# Patient Record
Sex: Male | Born: 1950 | Race: White | Hispanic: No | Marital: Married | Smoking: Former smoker
Health system: Southern US, Community
[De-identification: ages and names within clinical notes are randomized; demographics above are authoritative.]

## PROBLEM LIST (undated history)

## (undated) DIAGNOSIS — E119 Type 2 diabetes mellitus without complications: Secondary | ICD-10-CM

## (undated) HISTORY — PX: COLON SURGERY: SHX602

---

## 2018-12-28 ENCOUNTER — Other Ambulatory Visit: Payer: Self-pay

## 2018-12-28 ENCOUNTER — Inpatient Hospital Stay (HOSPITAL_COMMUNITY)
Admission: EM | Admit: 2018-12-28 | Discharge: 2019-01-01 | DRG: 698 | Disposition: A | Discharge status: Home / Self Care | Payer: Medicare Other | Attending: Internal Medicine | Admitting: Internal Medicine

## 2018-12-28 ENCOUNTER — Emergency Department (HOSPITAL_COMMUNITY): Payer: Medicare Other

## 2018-12-28 ENCOUNTER — Encounter (HOSPITAL_COMMUNITY): Payer: Self-pay | Admitting: *Deleted

## 2018-12-28 DIAGNOSIS — E1165 Type 2 diabetes mellitus with hyperglycemia: Secondary | ICD-10-CM | POA: Diagnosis present

## 2018-12-28 DIAGNOSIS — M62830 Muscle spasm of back: Secondary | ICD-10-CM | POA: Diagnosis present

## 2018-12-28 DIAGNOSIS — Y95 Nosocomial condition: Secondary | ICD-10-CM | POA: Diagnosis present

## 2018-12-28 DIAGNOSIS — Z955 Presence of coronary angioplasty implant and graft: Secondary | ICD-10-CM

## 2018-12-28 DIAGNOSIS — E871 Hypo-osmolality and hyponatremia: Secondary | ICD-10-CM | POA: Diagnosis present

## 2018-12-28 DIAGNOSIS — K7460 Unspecified cirrhosis of liver: Secondary | ICD-10-CM | POA: Diagnosis present

## 2018-12-28 DIAGNOSIS — K7581 Nonalcoholic steatohepatitis (NASH): Secondary | ICD-10-CM | POA: Diagnosis present

## 2018-12-28 DIAGNOSIS — E876 Hypokalemia: Secondary | ICD-10-CM | POA: Diagnosis present

## 2018-12-28 DIAGNOSIS — D696 Thrombocytopenia, unspecified: Secondary | ICD-10-CM | POA: Diagnosis present

## 2018-12-28 DIAGNOSIS — I10 Essential (primary) hypertension: Secondary | ICD-10-CM

## 2018-12-28 DIAGNOSIS — I2699 Other pulmonary embolism without acute cor pulmonale: Secondary | ICD-10-CM

## 2018-12-28 DIAGNOSIS — A419 Sepsis, unspecified organism: Secondary | ICD-10-CM

## 2018-12-28 DIAGNOSIS — Z886 Allergy status to analgesic agent status: Secondary | ICD-10-CM

## 2018-12-28 DIAGNOSIS — R042 Hemoptysis: Secondary | ICD-10-CM | POA: Diagnosis present

## 2018-12-28 DIAGNOSIS — A4151 Sepsis due to Escherichia coli [E. coli]: Secondary | ICD-10-CM | POA: Diagnosis present

## 2018-12-28 DIAGNOSIS — Z91041 Radiographic dye allergy status: Secondary | ICD-10-CM

## 2018-12-28 DIAGNOSIS — I2510 Atherosclerotic heart disease of native coronary artery without angina pectoris: Secondary | ICD-10-CM | POA: Diagnosis present

## 2018-12-28 DIAGNOSIS — Z91048 Other nonmedicinal substance allergy status: Secondary | ICD-10-CM

## 2018-12-28 DIAGNOSIS — J189 Pneumonia, unspecified organism: Secondary | ICD-10-CM | POA: Diagnosis present

## 2018-12-28 DIAGNOSIS — Z881 Allergy status to other antibiotic agents status: Secondary | ICD-10-CM

## 2018-12-28 DIAGNOSIS — Z87891 Personal history of nicotine dependence: Secondary | ICD-10-CM

## 2018-12-28 DIAGNOSIS — Z932 Ileostomy status: Secondary | ICD-10-CM | POA: Diagnosis not present

## 2018-12-28 DIAGNOSIS — N390 Urinary tract infection, site not specified: Secondary | ICD-10-CM | POA: Diagnosis present

## 2018-12-28 DIAGNOSIS — Z888 Allergy status to other drugs, medicaments and biological substances status: Secondary | ICD-10-CM | POA: Diagnosis not present

## 2018-12-28 DIAGNOSIS — R197 Diarrhea, unspecified: Secondary | ICD-10-CM | POA: Diagnosis present

## 2018-12-28 DIAGNOSIS — T83518A Infection and inflammatory reaction due to other urinary catheter, initial encounter: Secondary | ICD-10-CM | POA: Diagnosis not present

## 2018-12-28 DIAGNOSIS — Z933 Colostomy status: Secondary | ICD-10-CM

## 2018-12-28 DIAGNOSIS — Z936 Other artificial openings of urinary tract status: Secondary | ICD-10-CM

## 2018-12-28 DIAGNOSIS — K621 Rectal polyp: Secondary | ICD-10-CM | POA: Diagnosis present

## 2018-12-28 DIAGNOSIS — Z1623 Resistance to quinolones and fluoroquinolones: Secondary | ICD-10-CM | POA: Diagnosis present

## 2018-12-28 DIAGNOSIS — R161 Splenomegaly, not elsewhere classified: Secondary | ICD-10-CM | POA: Diagnosis present

## 2018-12-28 DIAGNOSIS — Z801 Family history of malignant neoplasm of trachea, bronchus and lung: Secondary | ICD-10-CM

## 2018-12-28 DIAGNOSIS — Y846 Urinary catheterization as the cause of abnormal reaction of the patient, or of later complication, without mention of misadventure at the time of the procedure: Secondary | ICD-10-CM | POA: Diagnosis present

## 2018-12-28 HISTORY — DX: Type 2 diabetes mellitus without complications: E11.9

## 2018-12-28 LAB — URINALYSIS, ROUTINE W REFLEX MICROSCOPIC
Bilirubin Urine: NEGATIVE
Glucose, UA: NEGATIVE mg/dL
Ketones, ur: NEGATIVE mg/dL
Nitrite: POSITIVE — AB
Protein, ur: 100 mg/dL — AB
pH: 6 (ref 5.0–8.0)

## 2018-12-28 LAB — CBC WITH DIFFERENTIAL/PLATELET
Abs Immature Granulocytes: 0.08 10*3/uL — ABNORMAL HIGH (ref 0.00–0.07)
BASOS ABS: 0 10*3/uL (ref 0.0–0.1)
Basophils Relative: 0 %
Eosinophils Absolute: 0 10*3/uL (ref 0.0–0.5)
Eosinophils Relative: 0 %
HCT: 37.3 % — ABNORMAL LOW (ref 39.0–52.0)
Hemoglobin: 12.2 g/dL — ABNORMAL LOW (ref 13.0–17.0)
Immature Granulocytes: 1 %
Lymphocytes Relative: 5 %
Lymphs Abs: 0.8 10*3/uL (ref 0.7–4.0)
MCH: 29.2 pg (ref 26.0–34.0)
MCHC: 32.7 g/dL (ref 30.0–36.0)
MCV: 89.2 fL (ref 80.0–100.0)
Monocytes Absolute: 1.5 10*3/uL — ABNORMAL HIGH (ref 0.1–1.0)
Monocytes Relative: 10 %
NEUTROS PCT: 84 %
NRBC: 0 % (ref 0.0–0.2)
Neutro Abs: 12.8 10*3/uL — ABNORMAL HIGH (ref 1.7–7.7)
Platelets: 131 10*3/uL — ABNORMAL LOW (ref 150–400)
RBC: 4.18 MIL/uL — ABNORMAL LOW (ref 4.22–5.81)
RDW: 12.3 % (ref 11.5–15.5)
WBC: 15.2 10*3/uL — ABNORMAL HIGH (ref 4.0–10.5)

## 2018-12-28 LAB — COMPREHENSIVE METABOLIC PANEL
ALBUMIN: 3 g/dL — AB (ref 3.5–5.0)
ALT: 22 U/L (ref 0–44)
AST: 26 U/L (ref 15–41)
Alkaline Phosphatase: 60 U/L (ref 38–126)
Anion gap: 13 (ref 5–15)
BUN: 20 mg/dL (ref 8–23)
CO2: 22 mmol/L (ref 22–32)
Calcium: 9.5 mg/dL (ref 8.9–10.3)
Chloride: 93 mmol/L — ABNORMAL LOW (ref 98–111)
Creatinine, Ser: 1.04 mg/dL (ref 0.61–1.24)
GFR calc Af Amer: >60 mL/min (ref >60)
GFR calc non Af Amer: >60 mL/min (ref >60)
Glucose, Bld: 285 mg/dL — ABNORMAL HIGH (ref 70–99)
Potassium: 4.2 mmol/L (ref 3.5–5.1)
Sodium: 128 mmol/L — ABNORMAL LOW (ref 135–145)
Total Bilirubin: 1.3 mg/dL — ABNORMAL HIGH (ref 0.3–1.2)
Total Protein: 7.1 g/dL (ref 6.5–8.1)

## 2018-12-28 LAB — BLOOD GAS, VENOUS
Acid-Base Excess: 0.4 mmol/L (ref 0.0–2.0)
Bicarbonate: 25.2 mmol/L (ref 20.0–28.0)
FIO2: 0.21
O2 Saturation: 96.2 %
Patient temperature: 37
pCO2, Ven: 33 mmHg — ABNORMAL LOW (ref 44.0–60.0)
pH, Ven: 7.469 — ABNORMAL HIGH (ref 7.250–7.430)
pO2, Ven: 79.5 mmHg — ABNORMAL HIGH (ref 32.0–45.0)

## 2018-12-28 LAB — LACTIC ACID, PLASMA
Lactic Acid, Venous: 3.2 mmol/L (ref 0.5–1.9)
Lactic Acid, Venous: 2.9 mmol/L (ref 0.5–1.9)

## 2018-12-28 LAB — URINALYSIS, MICROSCOPIC (REFLEX): Squamous Epithelial / HPF: NONE SEEN (ref 0–5)

## 2018-12-28 LAB — TROPONIN I: Troponin I: <0.03 ng/mL (ref <0.03)

## 2018-12-28 MED ORDER — ACETAMINOPHEN 325 MG PO TABS
650.0000 mg | ORAL_TABLET | Freq: Four times a day (QID) | ORAL | Status: DC | PRN
  Administered 2018-12-30: 650 mg via ORAL
  Filled 2018-12-28: qty 2

## 2018-12-28 MED ORDER — ZINC GLUCONATE 50 MG PO TABS: 50.0000 mg | ORAL_TABLET | Freq: Two times a day (BID) | ORAL | Status: DC

## 2018-12-28 MED ORDER — MAGNESIUM CARBONATE 54 (MAG EQUIV) MG/5ML PO LIQD
5.0000 mL | Freq: Four times a day (QID) | ORAL | Status: DC
  Filled 2018-12-28 (×14): qty 5

## 2018-12-28 MED ORDER — HYDROCHLOROTHIAZIDE 25 MG PO TABS: 25.0000 mg | ORAL_TABLET | Freq: Every day | ORAL | Status: DC

## 2018-12-28 MED ORDER — VANCOMYCIN HCL 10 G IV SOLR
2000.0000 mg | Freq: Once | INTRAVENOUS | Status: DC
  Filled 2018-12-28: qty 2000

## 2018-12-28 MED ORDER — METHYLPREDNISOLONE SODIUM SUCC 40 MG IJ SOLR
40.0000 mg | Freq: Once | INTRAMUSCULAR | Status: AC
  Administered 2018-12-28: 40 mg via INTRAVENOUS
  Filled 2018-12-28: qty 1

## 2018-12-28 MED ORDER — DIPHENHYDRAMINE HCL 50 MG/ML IJ SOLN
50.0000 mg | Freq: Once | INTRAMUSCULAR | Status: AC
  Administered 2018-12-28: 50 mg via INTRAVENOUS
  Filled 2018-12-28: qty 1

## 2018-12-28 MED ORDER — SODIUM CHLORIDE 0.9 % IV SOLN
1000.0000 mL | INTRAVENOUS | Status: DC
  Administered 2018-12-28 – 2018-12-29 (×2): 1000 mL via INTRAVENOUS

## 2018-12-28 MED ORDER — OXYBUTYNIN CHLORIDE 5 MG PO TABS
5.0000 mg | ORAL_TABLET | Freq: Three times a day (TID) | ORAL | Status: DC | PRN
  Administered 2018-12-30 – 2018-12-31 (×2): 5 mg via ORAL
  Filled 2018-12-28 (×2): qty 1

## 2018-12-28 MED ORDER — IOPAMIDOL (ISOVUE-300) INJECTION 61%
100.0000 mL | Freq: Once | INTRAVENOUS | Status: AC | PRN
  Administered 2018-12-28: 100 mL via INTRAVENOUS

## 2018-12-28 MED ORDER — FERROUS SULFATE 325 (65 FE) MG PO TABS
325.0000 mg | ORAL_TABLET | Freq: Two times a day (BID) | ORAL | Status: DC
  Administered 2018-12-29 – 2019-01-01 (×7): 325 mg via ORAL
  Filled 2018-12-28 (×8): qty 1

## 2018-12-28 MED ORDER — CALCIUM CARBONATE-VITAMIN D 500-200 MG-UNIT PO TABS
1.0000 | ORAL_TABLET | Freq: Two times a day (BID) | ORAL | Status: DC
  Administered 2018-12-29 – 2019-01-01 (×7): 1 via ORAL
  Filled 2018-12-28 (×8): qty 1

## 2018-12-28 MED ORDER — SPIRONOLACTONE 25 MG PO TABS: 25.0000 mg | ORAL_TABLET | Freq: Every day | ORAL | Status: DC

## 2018-12-28 MED ORDER — SODIUM CHLORIDE 0.9 % IV SOLN
2.0000 g | Freq: Three times a day (TID) | INTRAVENOUS | Status: DC
  Administered 2018-12-29 – 2019-01-01 (×9): 2 g via INTRAVENOUS
  Filled 2018-12-28 (×15): qty 2

## 2018-12-28 MED ORDER — METOPROLOL TARTRATE 25 MG PO TABS
25.0000 mg | ORAL_TABLET | Freq: Every day | ORAL | Status: DC
  Administered 2018-12-29 – 2018-12-30 (×2): 25 mg via ORAL
  Filled 2018-12-28 (×2): qty 1

## 2018-12-28 MED ORDER — VANCOMYCIN HCL IN DEXTROSE 1-5 GM/200ML-% IV SOLN: 1000.0000 mg | Freq: Once | INTRAVENOUS | Status: DC

## 2018-12-28 MED ORDER — LOPERAMIDE HCL 2 MG PO CAPS
2.0000 mg | ORAL_CAPSULE | Freq: Four times a day (QID) | ORAL | Status: DC | PRN
  Administered 2018-12-30 – 2018-12-31 (×3): 2 mg via ORAL
  Filled 2018-12-28 (×3): qty 1

## 2018-12-28 MED ORDER — ADULT MULTIVITAMIN W/MINERALS CH
1.0000 | ORAL_TABLET | Freq: Every day | ORAL | Status: DC
  Administered 2018-12-29 – 2019-01-01 (×4): 1 via ORAL
  Filled 2018-12-28 (×4): qty 1

## 2018-12-28 MED ORDER — SODIUM CHLORIDE 0.9 % IV SOLN
1000.0000 mL | INTRAVENOUS | Status: DC
  Administered 2018-12-28: 1000 mL via INTRAVENOUS

## 2018-12-28 MED ORDER — INSULIN GLARGINE 100 UNIT/ML ~~LOC~~ SOLN
128.0000 [IU] | Freq: Every morning | SUBCUTANEOUS | Status: DC
  Administered 2018-12-29 – 2019-01-01 (×4): 128 [IU] via SUBCUTANEOUS
  Filled 2018-12-28 (×5): qty 1.28

## 2018-12-28 MED ORDER — VANCOMYCIN HCL 10 G IV SOLR
1250.0000 mg | Freq: Two times a day (BID) | INTRAVENOUS | Status: DC
  Filled 2018-12-28 (×3): qty 1250

## 2018-12-28 MED ORDER — SODIUM CHLORIDE 0.9 % IV SOLN
2.0000 g | Freq: Once | INTRAVENOUS | Status: DC
  Filled 2018-12-28: qty 2

## 2018-12-28 MED ORDER — SODIUM CHLORIDE 0.9 % IV SOLN
2.0000 g | Freq: Once | INTRAVENOUS | Status: AC
  Administered 2018-12-28: 2 g via INTRAVENOUS
  Filled 2018-12-28: qty 2

## 2018-12-28 MED ORDER — PANTOPRAZOLE SODIUM 40 MG PO TBEC
40.0000 mg | DELAYED_RELEASE_TABLET | Freq: Every day | ORAL | Status: DC
  Administered 2018-12-29 – 2019-01-01 (×4): 40 mg via ORAL
  Filled 2018-12-28 (×4): qty 1

## 2018-12-28 MED ORDER — ACETAMINOPHEN 650 MG RE SUPP: 650.0000 mg | Freq: Four times a day (QID) | RECTAL | Status: DC | PRN

## 2018-12-28 MED ORDER — ISOSORBIDE MONONITRATE 10 MG PO TABS
15.0000 mg | ORAL_TABLET | Freq: Two times a day (BID) | ORAL | Status: DC
  Administered 2018-12-29 – 2019-01-01 (×7): 15 mg via ORAL
  Filled 2018-12-28 (×12): qty 1.5

## 2018-12-28 MED ORDER — INSULIN ASPART 100 UNIT/ML ~~LOC~~ SOLN
0.0000 [IU] | Freq: Three times a day (TID) | SUBCUTANEOUS | Status: DC
  Administered 2018-12-29: 7 [IU] via SUBCUTANEOUS
  Administered 2018-12-29: 9 [IU] via SUBCUTANEOUS
  Administered 2018-12-29: 5 [IU] via SUBCUTANEOUS
  Administered 2018-12-30: 3 [IU] via SUBCUTANEOUS
  Administered 2018-12-30: 5 [IU] via SUBCUTANEOUS
  Administered 2018-12-31: 2 [IU] via SUBCUTANEOUS
  Administered 2019-01-01: 7 [IU] via SUBCUTANEOUS

## 2018-12-28 NOTE — ED Provider Notes (Signed)
[facility] EMERGENCY DEPARTMENT Provider Note   CSN: [id number] Arrival date & time: 12/28/18  1644     History   Chief Complaint Chief Complaint  Patient presents with  . Flank Pain    right    HPI Shaaron Adlerrthur G Landgren is a 68 y.o. male.  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  The patient has done well since his surgery 2 months ago however in the last week he has developed some fevers as high as 101.9, increased amounts of coughing, feeling pain with deep breathing mostly located in the right chest and radiating to the right shoulder and today had some hemoptysis.  He has been taking tylenol with some relief of fever.  He has had no changes in amount of urine made -d rinking plenty of fluids - no diarrhea, rectal bleeding or increased abdominal pain.  He reports having a history of allergy to IV contrast dye which gives him hives and for which she is able to get a CT scan if he is pretreated.    He is visiting from [place].  Past Medical History:  Diagnosis Date  . Diabetes mellitus without complication (HCC)    type 2    There are no active problems to display for this patient.     Home Medications    Prior to Admission medications   Not on File    Family History No family history on file.  Social History Social History   Tobacco Use  . Smoking status: Former Games developermoker  .  Smokeless tobacco: Never Used  Substance Use Topics  . Alcohol use: Never    Frequency: Never  . Drug use: Never     Allergies   Patient has no allergy information on record.   Review of Systems Review of Systems  All other systems reviewed and are negative.    Physical Exam Updated Vital Signs BP 113/72 (BP Location: Right Arm)   Pulse 80   Temp 98.3 F (36.8 C) (Oral)   Resp 18   Ht 1.778 m (5\' 10" )   Wt 99.8 kg   SpO2 92%   BMI 31.57 kg/m   Physical Exam Vitals signs and nursing note reviewed.  Constitutional:      General: He is not in acute distress.    Appearance: He is well-developed.  HENT:     Head: Normocephalic and atraumatic.     Mouth/Throat:     Pharynx: No oropharyngeal exudate.  Eyes:     General: No scleral icterus.       Right eye: No discharge.        Left eye: No discharge.     Conjunctiva/sclera: Conjunctivae normal.     Pupils: Pupils are equal, round, and reactive to light.  Neck:     Musculoskeletal: Normal range of motion and neck supple.     Thyroid: No thyromegaly.  Vascular: No JVD.  Cardiovascular:     Rate and Rhythm: Normal rate and regular rhythm.     Heart sounds: Normal heart sounds. No murmur. No friction rub. No gallop.   Pulmonary:     Effort: Pulmonary effort is normal. No respiratory distress.     Breath sounds: Rales present. No wheezing.     Comments: The patient appears mildly tachypneic, oxygen ranges between 92 and 95%, he has rales at the right base and right midlung, left lung is clear. Abdominal:     General: Bowel sounds are normal. There is no distension.     Palpations: Abdomen is soft. There is no mass.     Tenderness: There is abdominal tenderness ( Mild tenderness to the mid lower abdomen).     Comments: Diverting ileostomy present in the right mid abdomen, formed brown stool present  Genitourinary:    Comments: Both Foley catheter and suprapubic catheter in place Musculoskeletal: Normal range of  motion.        General: No tenderness.     Comments: Extremities are symmetrical, minimal scant edema bilaterally, symmetrical  Lymphadenopathy:     Cervical: No cervical adenopathy.  Skin:    General: Skin is warm and dry.     Findings: No erythema or rash.     Comments: Scattered nonpruritic rash over the bilateral lower extremities  Neurological:     Mental Status: He is alert.     Coordination: Coordination normal.     Comments: Awake and alert and answering questions appropriately, follows commands without any difficulty  Psychiatric:        Behavior: Behavior normal.      ED Treatments / Results  Labs (all labs ordered are listed, but only abnormal results are displayed) Labs Reviewed  CULTURE, BLOOD (ROUTINE X 2)  CULTURE, BLOOD (ROUTINE X 2)  URINE CULTURE  CBC WITH DIFFERENTIAL/PLATELET  COMPREHENSIVE METABOLIC PANEL  TROPONIN I  BLOOD GAS, VENOUS  URINALYSIS, ROUTINE W REFLEX MICROSCOPIC  LACTIC ACID, PLASMA  LACTIC ACID, PLASMA    EKG None  Radiology No results found.  Procedures .Critical Care Performed by: Eber Hong, MD Authorized by: Eber Hong, MD   Critical care provider statement:    Critical care time (minutes):  35   Critical care time was exclusive of:  Separately billable procedures and treating other patients and teaching time   Critical care was necessary to treat or prevent imminent or life-threatening deterioration of the following conditions:  Sepsis   Critical care was time spent personally by me on the following activities:  Blood draw for specimens, development of treatment plan with patient or surrogate, discussions with consultants, evaluation of patient's response to treatment, examination of patient, obtaining history from patient or surrogate, ordering and performing treatments and interventions, ordering and review of laboratory studies, ordering and review of radiographic studies, pulse oximetry, re-evaluation of patient's  condition and review of old charts   (including critical care time)  Medications Ordered in ED Medications  0.9 %  sodium chloride infusion (has no administration in time range)  vancomycin (VANCOCIN) IVPB 1000 mg/200 mL premix (has no administration in time range)  ceFEPIme (MAXIPIME) 2 g in sodium chloride 0.9 % 100 mL IVPB (has no administration in time range)     Initial Impression / Assessment and Plan / ED Course  I have reviewed the triage vital signs and the nursing notes.  Pertinent labs & imaging results that were available during my care of the patient  were reviewed by me and considered in my medical decision making (see chart for details).    Certainly with this patient's postoperative period and now having hemoptysis pleuritic chest pain and a cough I am concerned about a pulmonary embolism however I would also consider pneumonia given the fevers rales at the right base etc.  He will need further imaging and likely needs to have a CT scan, he will need pretreatment with steroids and Benadryl which he states he has had successfully in the past.  Because of his history of abdominal surgery this will be also imaged with CT scan to make sure there is no developing source of infection in his pelvis.  I have personally looked at the patient's x-ray of the chest and it does confirm that he has some right basilar infiltrates consistent with an infiltrate but would also consider that this could be pulmonary embolism.  He also has a urinary tract infection, he has a leukocytosis, a hyponatremia, and hyperglycemia with a blood sugar of 285.  Troponin is negative  At this time the etiology of the patient's symptoms will need admission to the hospital whether this is sepsis from pneumonia or urinary infection or whether this is related to an underlying pulmonary embolism.  Because of his allergies he has been pretreated for the CT scan but this will not be able to be performed for several more  hours.  Broad-spectrum antibiotics have been ordered including Maxipime and vancomycin.  Cultures of the blood in the urine have been obtained  maxipime allergy - aztreonam added CT ordered Dr. Sharl MaLama to admit Pt has sepsis, CC provided  Final Clinical Impressions(s) / ED Diagnoses   Final diagnoses:  Sepsis without acute organ dysfunction, due to unspecified organism [facility](HCC)      Eber HongMiller, Dontravious Camille, MD 12/28/18 1924

## 2018-12-28 NOTE — ED Triage Notes (Signed)
Patient had surgery on October 24, 2018 to correct a fistula from May  2012.  Patient now has suprapubic foley, indwelling foley and colostomy bag (right lower quadrant).  Patient complains of right sided flank pain with productive cough including bright red blood as described by patient for 2 days.  Patient is alert, oriented x's 4 and can ambulate.

## 2018-12-28 NOTE — H&P (Addendum)
TRH H&P    Patient Demographics:    Michael Salinas, is a 68 y.o. male  MRN: [id number]  DOB - 08-Feb-1951  Admit Date - 12/28/2018  Referring MD/NP/PA: Eber Hong  Outpatient Primary MD for the patient is System, Pcp Not In  Patient coming from: Home  Chief complaint-coughing up blood   HPI:    Michael Salinas  is a 68 y.o. 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 Patient says that he has done well since surgery but over past 2 days he developed fever as high as one 1.9 with worsening cough initially white to yellow in color and became bloody this morning. He denies nausea vomiting.  Denies abdominal pain. Denies shortness of breath. In the ED chest x-ray showed possible right lower lobe pneumonia and patient started on vancomycin and cefepime. CTA chest was ordered to rule out PE but patient has allergy to IV contrast dye and is currently being pretreated with Benadryl and Solu-Medrol before the CT scan.  No previous history of stroke or seizures.   Review of systems:    In addition to the HPI above,   All other systems reviewed and are negative.    Past History of the following :    Past Medical History:  Diagnosis Date  . Diabetes mellitus without complication (HCC)    type 2         Social History:      Social History   Tobacco Use  . Smoking status: Former Games developer  . Smokeless tobacco: Never Used  Substance Use Topics  . Alcohol use: Never    Frequency: Never       Family  History :   Patient's mother side there is a family history of lung cancer.   Home Medications:   Prior to Admission medications   Medication Sig Start Date End Date Taking? Authorizing Provider  ascorbic acid (VITAMIN C) 1000 MG tablet Take 1,000 mg by mouth 2 (two) times daily.   Yes [provider]  aspirin EC 81 MG tablet Take 81 mg by mouth daily.   Yes [provider]  calcium citrate-vitamin D (CITRACAL+D) 315-200 MG-UNIT tablet Take 1 tablet by mouth 2 (two) times daily.   Yes [provider]  Dulaglutide (TRULICITY) 0.75 MG/0.5ML SOPN Inject 0.75 mg into the skin every Sunday.   Yes [provider]  eplerenone (INSPRA) 50 MG tablet Take 50 mg by mouth every morning.   Yes [provider]  ferrous sulfate 325 (65 FE) MG tablet Take 325 mg by mouth 2 (two) times daily.   Yes [provider]  hydrochlorothiazide (HYDRODIURIL) 25 MG tablet Take 25 mg  by mouth daily.   Yes [provider]  HYDROmorphone (DILAUDID) 2 MG tablet Take 2 mg by mouth every 4 (four) hours.   Yes [provider]  insulin aspart (NOVOLOG FLEXPEN) 100 UNIT/ML FlexPen Inject 12 Units into the skin 3 (three) times daily with meals. Add 2 units for every level of 50 over as directed by physician per sliding scale   Yes [provider]  Insulin Degludec (TRESIBA FLEXTOUCH) 200 UNIT/ML SOPN Inject 128 Units into the skin every morning.   Yes [provider]  isosorbide mononitrate (ISMO,MONOKET) 10 MG tablet Take 15 mg by mouth 2 (two) times daily.   Yes [provider]  loperamide (IMODIUM A-D) 2 MG tablet Take 2 mg by mouth 4 (four) times daily as needed for diarrhea or loose stools.   Yes [provider]  Magnesium Carbonate (MAGONATE) 54 (Mag Equiv) MG/5ML LIQD Take 5 mLs by mouth 4 (four) times daily.   Yes [provider]  metFORMIN (GLUCOPHAGE) 1000 MG tablet Take 1,000 mg by mouth 2 (two) times daily  with a meal.   Yes [provider]  metoprolol tartrate (LOPRESSOR) 25 MG tablet Take 25 mg by mouth daily.   Yes [provider]  Multiple Vitamin (MULTIVITAMIN WITH MINERALS) TABS tablet Take 1 tablet by mouth daily.   Yes [provider]  nystatin (MYCOSTATIN/NYSTOP) powder Apply topically 2 (two) times daily as needed (for redness/irritation).   Yes [provider]  omeprazole (PRILOSEC) 20 MG capsule Take 20 mg by mouth every morning.   Yes [provider]  OVER THE COUNTER MEDICATION Take 1,350 mg by mouth 2 (two) times daily. [place]   Yes [provider]  oxybutynin (DITROPAN) 5 MG tablet Take 5 mg by mouth 3 (three) times daily as needed for bladder spasms.   Yes [provider]  prasugrel (EFFIENT) 5 MG TABS tablet Take 5 mg by mouth daily.   Yes [provider]  Vitamin D, Ergocalciferol, (DRISDOL) 1.25 MG (50000 UT) CAPS capsule Take 50,000 Units by mouth 2 (two) times a week. Mondays and Thursdays   Yes [provider]  zinc gluconate 50 MG tablet Take 50 mg by mouth 2 (two) times daily.   Yes [provider]     Allergies:     Allergies  Allergen Reactions  . Ace Inhibitors Swelling  . Amlodipine Swelling  . Ceftriaxone Hives and Other (See Comments)    Reaction is unknown to patient. Possible reaction resulting in hives   . Nsaids Other (See Comments)    Stomach pain  . Phenergan [Promethazine Hcl] Other (See Comments)    hallucinations  . Versed [Midazolam] Nausea And Vomiting  . Adhesive [Tape] Rash    Skin irritation  . Contrast Media [Iodinated Diagnostic Agents] Rash  . Latex Rash    Skin irritation-severe     Physical Exam:   Vitals  Blood pressure 127/65, pulse 74, temperature 98.7 F (37.1 C), temperature source Oral, resp. rate 17, height 5\' 10"  (1.778 m), weight 99.8 kg, SpO2 94 %.  1.  General: Appears in no acute distress  2. Psychiatric: Alert,  oriented x3, intact insight and judgment  3. Neurologic: Cranial nerves II through XII grossly intact, motor strength is 5/5 in both upper and lower extremities  4. HEENMT:  Atraumatic normocephalic, oral mucosa is mildly dry.  PERRLA  5. Respiratory : Normal respiratory effort.  Crackles auscultated at right lung base.  6. Cardiovascular : S1-S2,  regular, no murmurs auscultated.  No edema of the lower extremities noted  7. Gastrointestinal:  Abdomen is soft, nontender, ileostomy in place.  Suprapubic catheter in place  8. Skin:  No rash noted  9.Musculoskeletal:  No limitation of range of motion of joints of upper and lower extremities    Data Review:    CBC Recent Labs  Lab 12/28/18 1723  WBC 15.2*  HGB 12.2*  HCT 37.3*  PLT 131*  MCV 89.2  MCH 29.2  MCHC 32.7  RDW 12.3  LYMPHSABS 0.8  MONOABS 1.5*  EOSABS 0.0  BASOSABS 0.0   ------------------------------------------------------------------------------------------------------------------  Results for orders placed or performed during the hospital encounter of 12/28/18 (from the past 48 hour(s))  CBC with Differential/Platelet     Status: Abnormal   Collection Time: 12/28/18  5:23 PM  Result Value Ref Range   WBC 15.2 (H) 4.0 - 10.5 K/uL   RBC 4.18 (L) 4.22 - 5.81 MIL/uL   Hemoglobin 12.2 (L) 13.0 - 17.0 g/dL   HCT 69.6 (L) 29.5 - 28.4 %   MCV 89.2 80.0 - 100.0 fL   MCH 29.2 26.0 - 34.0 pg   MCHC 32.7 30.0 - 36.0 g/dL   RDW 13.2 44.0 - 10.2 %   Platelets 131 (L) 150 - 400 K/uL   nRBC 0.0 0.0 - 0.2 %   Neutrophils Relative % 84 %   Neutro Abs 12.8 (H) 1.7 - 7.7 K/uL   Lymphocytes Relative 5 %   Lymphs Abs 0.8 0.7 - 4.0 K/uL   Monocytes Relative 10 %   Monocytes Absolute 1.5 (H) 0.1 - 1.0 K/uL   Eosinophils Relative 0 %   Eosinophils Absolute 0.0 0.0 - 0.5 K/uL   Basophils Relative 0 %   Basophils Absolute 0.0 0.0 - 0.1 K/uL   Immature Granulocytes 1 %   Abs Immature Granulocytes 0.08 (H) 0.00 -  0.07 K/uL    Comment: Performed at [facility], [address] [place], [place] [address]  Comprehensive metabolic panel     Status: Abnormal   Collection Time: 12/28/18  5:23 PM  Result Value Ref Range   Sodium 128 (L) 135 - 145 mmol/L   Potassium 4.2 3.5 - 5.1 mmol/L   Chloride 93 (L) 98 - 111 mmol/L   CO2 22 22 - 32 mmol/L   Glucose, Bld 285 (H) 70 - 99 mg/dL   BUN 20 8 - 23 mg/dL   Creatinine, Ser 6.44 0.61 - 1.24 mg/dL   Calcium 9.5 8.9 - 03.4 mg/dL   Total Protein 7.1 6.5 - 8.1 g/dL   Albumin 3.0 (L) 3.5 - 5.0 g/dL   AST 26 15 - 41 U/L   ALT 22 0 - 44 U/L   Alkaline Phosphatase 60 38 - 126 U/L   Total Bilirubin 1.3 (H) 0.3 - 1.2 mg/dL   GFR calc non Af Amer >60 >60 mL/min   GFR calc Af Amer >60 >60 mL/min   Anion gap 13 5 - 15    Comment: Performed at [facility], [address], [place], [place] [address]  Troponin I - ONCE - STAT     Status: None   Collection Time: 12/28/18  5:23 PM  Result Value Ref Range   Troponin I <0.03 <0.03 ng/mL    Comment: Performed at [facility], [address], [place], [place] [address]  Blood gas, venous     Status: Abnormal   Collection Time: 12/28/18  5:35 PM  Result Value Ref Range  FIO2 0.21    pH, Ven 7.469 (H) 7.250 - 7.430   pCO2, Ven 33.0 (L) 44.0 - 60.0 mmHg   pO2, Ven 79.5 (H) 32.0 - 45.0 mmHg   Bicarbonate 25.2 20.0 - 28.0 mmol/L   Acid-Base Excess 0.4 0.0 - 2.0 mmol/L   O2 Saturation 96.2 %   Patient temperature 37.0     Comment: Performed at [facility], [address] [place], [place] [address]  Blood Culture (routine x 2)     Status: None (Preliminary result)   Collection Time: 12/28/18  5:36 PM  Result Value Ref Range   Specimen Description BLOOD LEFT HAND    Special Requests      BOTTLES DRAWN AEROBIC AND ANAEROBIC Blood Culture adequate volume Performed at [facility], [address] [place], [place] [address]    Culture PENDING    Report Status PENDING   Lactic acid, plasma     Status: Abnormal    Collection Time: 12/28/18  5:37 PM  Result Value Ref Range   Lactic Acid, Venous 3.2 (HH) 0.5 - 1.9 mmol/L    Comment: CRITICAL RESULT CALLED TO, READ BACK BY AND VERIFIED WITH: KEMP,C. AT 1856 ON 12/28/2018 BY EVA Performed at [facility], [address] [place], [place] [address]   Urinalysis, Routine w reflex microscopic     Status: Abnormal   Collection Time: 12/28/18  5:50 PM  Result Value Ref Range   Color, Urine BROWN (A) YELLOW    Comment: BIOCHEMICALS MAY BE AFFECTED BY COLOR   APPearance HAZY (A) CLEAR   Specific Gravity, Urine >1.030 (H) 1.005 - 1.030   pH 6.0 5.0 - 8.0   Glucose, UA NEGATIVE NEGATIVE mg/dL   Hgb urine dipstick LARGE (A) NEGATIVE   Bilirubin Urine NEGATIVE NEGATIVE   Ketones, ur NEGATIVE NEGATIVE mg/dL   Protein, ur 914100 (A) NEGATIVE mg/dL   Nitrite POSITIVE (A) NEGATIVE   Leukocytes, UA MODERATE (A) NEGATIVE    Comment: Performed at [facility], [address] [place], [place] [address]  Urinalysis, Microscopic (reflex)     Status: Abnormal   Collection Time: 12/28/18  5:50 PM  Result Value Ref Range   RBC / HPF 11-20 0 - 5 RBC/hpf   WBC, UA 21-50 0 - 5 WBC/hpf   Bacteria, UA MANY (A) NONE SEEN   Squamous Epithelial / LPF NONE SEEN 0 - 5   Mucus PRESENT     Comment: Performed at [facility], [address] [place], [place] [address]    Chemistries  Recent Labs  Lab 12/28/18 1723  NA 128*  K 4.2  CL 93*  CO2 22  GLUCOSE 285*  BUN 20  CREATININE 1.04  CALCIUM 9.5  AST 26  ALT 22  ALKPHOS 60  BILITOT 1.3*   ------------------------------------------------------------------------------------------------------------------  ------------------------------------------------------------------------------------------------------------------ GFR: Estimated Creatinine Clearance: 83.8 mL/min (by C-G formula based on SCr of 1.04 mg/dL). Liver Function Tests: Recent Labs  Lab 12/28/18 1723  AST 26  ALT 22  ALKPHOS 60  BILITOT 1.3*  PROT  7.1  ALBUMIN 3.0*    Recent Labs  Lab 12/28/18 1723  TROPONINI <0.03    --------------------------------------------------------------------------------------------------------------- Urine analysis:    Component Value Date/Time   COLORURINE BROWN (A) 12/28/2018 1750   APPEARANCEUR HAZY (A) 12/28/2018 1750   LABSPEC >1.030 (H) 12/28/2018 1750   PHURINE 6.0 12/28/2018 1750   GLUCOSEU NEGATIVE 12/28/2018 1750   HGBUR LARGE (A) 12/28/2018 1750   BILIRUBINUR NEGATIVE 12/28/2018 1750   KETONESUR NEGATIVE 12/28/2018  1750   PROTEINUR 100 (A) 12/28/2018 1750   NITRITE POSITIVE (A) 12/28/2018 1750   LEUKOCYTESUR MODERATE (A) 12/28/2018 1750      Imaging Results:    Dg Chest 2 View  Result Date: 12/28/2018 CLINICAL DATA:  Cough and fever. EXAM: CHEST - 2 VIEW COMPARISON:  None. FINDINGS: The heart, hila, and mediastinum are normal. There is opacity in the right base. No other acute abnormalities. IMPRESSION: Right basilar opacity concerning for pneumonia given history. Recommend short-term follow-up chest x-ray to ensure resolution of the pulmonary opacity. No other acute abnormalities identified. Electronically Signed   By: Gerome Sam III M.D   On: 12/28/2018 18:02       Assessment & Plan:    Active Problems:   HCAP (healthcare-associated pneumonia)   1. Healthcare associated pneumonia-admit the patient for healthcare associated pneumonia, he does have right basilar opacity concerning for pneumonia.  Started on vancomycin and cefepime.  Follow blood culture results.  2. Hemoptysis-likely from above, CT chest ordered to rule out PE.  Follow the results.  3. Diabetes mellitus type 2-we will continue with [place], start sliding scale insulin with NovoLog.  Hold metformin.  4. CAD status post stenting x2-aspirin and Effient are on hold as per the patient since the surgery.  Continue Imdur, metoprolol.  5. Hypertension-blood pressure stable, continue HCTZ, metoprolol,  eplerenone.  6. Status post corrective surgery for rectovesicular fistula-stable, followed by [facility] as outpatient.  7. Hyponatremia- likely from persistent diarrhea. Will check serum osmolality. Follow serum sodium level in am.   DVT Prophylaxis-   Lovenox   AM Labs Ordered, also please review Full Orders  Family Communication: Admission, patients condition and plan of care including tests being ordered have been discussed with the patient and family members at bedside* who indicate understanding and agree with the plan and Code Status.  Code Status: Full code  Admission status: Inpatient: Based on patients clinical presentation and evaluation of above clinical data, I have made determination that patient meets Inpatient criteria at this time.  Patient be admitted healthcare associated pneumonia, on IV antibiotics.  Follow blood culture results.  Time spent in minutes : 60 minutes   Meredeth Ide M.D on 12/28/2018 at 8:03 PM

## 2018-12-28 NOTE — ED Notes (Signed)
Date and time results received: 12/28/18 2020 (use smartphrase ".now" to insert current time)  Test: lactic acid Critical Value: 2.9  Name of Provider Notified: Dr. Sharl Ma  Orders Received? Or Actions Taken?: none at this time

## 2018-12-28 NOTE — Progress Notes (Signed)
Pharmacy Antibiotic Note  Michael Salinas is a 68 y.o. male admitted on 12/28/2018 with pneumonia.  Pharmacy has been consulted for Aztreonam and Vancomycin dosing.  Plan: Vancomycin 2000 mg IV x 1 dose Vancomycin 1250 mg IV every 12 hours.  Goal trough 15-20 mcg/mL.  Aztreonam 2000 mg IV every 8 hours. Monitor labs, c/s, and vanco trough as indicated.  Height: 5\' 10"  (177.8 cm) Weight: 220 lb (99.8 kg) IBW/kg (Calculated) : 73  Temp (24hrs), Avg:98.5 F (36.9 C), Min:98.3 F (36.8 C), Max:98.7 F (37.1 C)  Recent Labs  Lab 12/28/18 1723 12/28/18 1737  WBC 15.2*  --   CREATININE 1.04  --   LATICACIDVEN  --  3.2*    Estimated Creatinine Clearance: 83.8 mL/min (by C-G formula based on SCr of 1.04 mg/dL).    Allergies  Allergen Reactions  . Ace Inhibitors Swelling  . Amlodipine Swelling  . Ceftriaxone Hives and Other (See Comments)    Reaction is unknown to patient. Possible reaction resulting in hives   . Nsaids Other (See Comments)    Stomach pain  . Phenergan [Promethazine Hcl] Other (See Comments)    hallucinations  . Versed [Midazolam] Nausea And Vomiting  . Adhesive [Tape] Rash    Skin irritation  . Contrast Media [Iodinated Diagnostic Agents] Rash  . Latex Rash    Skin irritation-severe    Antimicrobials this admission: Vanco 1/18 >>  Aztreonam 1/18 >>   Dose adjustments this admission: N/A  Microbiology results: 1/18 BCx:  1/18 UCx:       Thank you for allowing pharmacy to be a part of this patient's care.  Tad Moore 12/28/2018 7:57 PM

## 2018-12-28 NOTE — ED Notes (Signed)
Date and time results received: 12/28/18 1859 (use smartphrase ".now" to insert current time)  Test: Lactic Critical Value: 3.2  Name of Provider Notified: Dr. Hyacinth Meeker  Orders Received? Or Actions Taken?: none at this time

## 2018-12-29 ENCOUNTER — Other Ambulatory Visit: Payer: Self-pay

## 2018-12-29 ENCOUNTER — Encounter (HOSPITAL_COMMUNITY): Payer: Self-pay

## 2018-12-29 ENCOUNTER — Inpatient Hospital Stay (HOSPITAL_COMMUNITY): Payer: Medicare Other

## 2018-12-29 DIAGNOSIS — A419 Sepsis, unspecified organism: Secondary | ICD-10-CM

## 2018-12-29 DIAGNOSIS — I2699 Other pulmonary embolism without acute cor pulmonale: Secondary | ICD-10-CM

## 2018-12-29 DIAGNOSIS — I2510 Atherosclerotic heart disease of native coronary artery without angina pectoris: Secondary | ICD-10-CM

## 2018-12-29 DIAGNOSIS — D696 Thrombocytopenia, unspecified: Secondary | ICD-10-CM

## 2018-12-29 DIAGNOSIS — I10 Essential (primary) hypertension: Secondary | ICD-10-CM

## 2018-12-29 LAB — CBC
HEMATOCRIT: 35.9 % — AB (ref 39.0–52.0)
Hemoglobin: 11.6 g/dL — ABNORMAL LOW (ref 13.0–17.0)
MCH: 29.7 pg (ref 26.0–34.0)
MCHC: 32.3 g/dL (ref 30.0–36.0)
MCV: 91.8 fL (ref 80.0–100.0)
Platelets: 102 10*3/uL — ABNORMAL LOW (ref 150–400)
RBC: 3.91 MIL/uL — ABNORMAL LOW (ref 4.22–5.81)
RDW: 12 % (ref 11.5–15.5)
WBC: 9.2 10*3/uL (ref 4.0–10.5)
nRBC: 0 % (ref 0.0–0.2)

## 2018-12-29 LAB — PROTIME-INR
INR: 1.36
Prothrombin Time: 16.7 seconds — ABNORMAL HIGH (ref 11.4–15.2)

## 2018-12-29 LAB — OSMOLALITY: OSMOLALITY: 282 mosm/kg (ref 275–295)

## 2018-12-29 LAB — GLUCOSE, CAPILLARY
Glucose-Capillary: 199 mg/dL — ABNORMAL HIGH (ref 70–99)
Glucose-Capillary: 251 mg/dL — ABNORMAL HIGH (ref 70–99)
Glucose-Capillary: 262 mg/dL — ABNORMAL HIGH (ref 70–99)
Glucose-Capillary: 309 mg/dL — ABNORMAL HIGH (ref 70–99)
Glucose-Capillary: 354 mg/dL — ABNORMAL HIGH (ref 70–99)

## 2018-12-29 LAB — HEMOGLOBIN A1C
Hgb A1c MFr Bld: 8.1 % — ABNORMAL HIGH (ref 4.8–5.6)
Mean Plasma Glucose: 185.77 mg/dL

## 2018-12-29 LAB — COMPREHENSIVE METABOLIC PANEL
ALBUMIN: 2.7 g/dL — AB (ref 3.5–5.0)
ALT: 20 U/L (ref 0–44)
AST: 19 U/L (ref 15–41)
Alkaline Phosphatase: 54 U/L (ref 38–126)
Anion gap: 10 (ref 5–15)
BILIRUBIN TOTAL: 1 mg/dL (ref 0.3–1.2)
BUN: 24 mg/dL — ABNORMAL HIGH (ref 8–23)
CO2: 24 mmol/L (ref 22–32)
Calcium: 8.9 mg/dL (ref 8.9–10.3)
Chloride: 98 mmol/L (ref 98–111)
Creatinine, Ser: 1.02 mg/dL (ref 0.61–1.24)
GFR calc Af Amer: >60 mL/min (ref >60)
GFR calc non Af Amer: >60 mL/min (ref >60)
GLUCOSE: 322 mg/dL — AB (ref 70–99)
Potassium: 4.7 mmol/L (ref 3.5–5.1)
Sodium: 132 mmol/L — ABNORMAL LOW (ref 135–145)
TOTAL PROTEIN: 6.7 g/dL (ref 6.5–8.1)

## 2018-12-29 LAB — APTT: aPTT: 43 seconds — ABNORMAL HIGH (ref 24–36)

## 2018-12-29 LAB — TROPONIN I
Troponin I: <0.03 ng/mL (ref <0.03)
Troponin I: <0.03 ng/mL (ref <0.03)

## 2018-12-29 LAB — ECHOCARDIOGRAM COMPLETE
Height: 70 in
Weight: 3319.25 oz

## 2018-12-29 LAB — HEPARIN LEVEL (UNFRACTIONATED): Heparin Unfractionated: 0.36 IU/mL (ref 0.30–0.70)

## 2018-12-29 MED ORDER — BENZONATATE 100 MG PO CAPS
200.0000 mg | ORAL_CAPSULE | Freq: Three times a day (TID) | ORAL | Status: DC
  Administered 2018-12-29 – 2018-12-31 (×7): 200 mg via ORAL
  Filled 2018-12-29 (×7): qty 2

## 2018-12-29 MED ORDER — HYDROMORPHONE HCL 2 MG PO TABS
2.0000 mg | ORAL_TABLET | ORAL | Status: DC | PRN
  Administered 2018-12-29 – 2018-12-31 (×7): 2 mg via ORAL
  Filled 2018-12-29 (×8): qty 1

## 2018-12-29 MED ORDER — GABAPENTIN 300 MG PO CAPS
300.0000 mg | ORAL_CAPSULE | Freq: Once | ORAL | Status: AC
  Administered 2018-12-29: 300 mg via ORAL
  Filled 2018-12-29: qty 1

## 2018-12-29 MED ORDER — SODIUM CHLORIDE 0.9 % IV SOLN: 1000.0000 mL | INTRAVENOUS | Status: AC

## 2018-12-29 MED ORDER — HEPARIN BOLUS VIA INFUSION
5000.0000 [IU] | Freq: Once | INTRAVENOUS | Status: AC
  Administered 2018-12-29: 5000 [IU] via INTRAVENOUS
  Filled 2018-12-29: qty 5000

## 2018-12-29 MED ORDER — VANCOMYCIN HCL 10 G IV SOLR
2000.0000 mg | Freq: Once | INTRAVENOUS | Status: DC
  Filled 2018-12-29: qty 2000

## 2018-12-29 MED ORDER — HEPARIN (PORCINE) 25000 UT/250ML-% IV SOLN
1650.0000 [IU]/h | INTRAVENOUS | Status: DC
  Administered 2018-12-29 – 2018-12-30 (×3): 1500 [IU]/h via INTRAVENOUS
  Filled 2018-12-29 (×5): qty 250

## 2018-12-29 MED ORDER — VANCOMYCIN HCL 10 G IV SOLR
1250.0000 mg | Freq: Two times a day (BID) | INTRAVENOUS | Status: DC
  Administered 2018-12-29 – 2018-12-31 (×4): 1250 mg via INTRAVENOUS
  Filled 2018-12-29 (×6): qty 1250

## 2018-12-29 NOTE — Progress Notes (Signed)
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  The patient had been complaining of pleuritic type chest pain during this period of time associated with his coughing.  He states that he is coughing up approximately half teaspoon of blood.  As result, he presented for further evaluation.  He has had fevers up to 1 1.9 F.  He denied any headache, neck pain, nausea, vomiting, diarrhea, hematochezia, melena.  Remarkably, the patient states that he was not particularly short of breath.  Upon presentation, initial chest x-ray suggested right lower lobe opacity.  CT angiogram of the chest showed large volume right lower lobe pulmonary emboli with reactive right hilar lymphadenopathy.  There is patchy right lower lobe airspace disease.  CT of the abdomen and pelvis did not show any acute findings.  He did have a cirrhotic appearing liver with some splenomegaly.  The patient was started on intravenous heparin, vancomycin, and aztreonam. Regarding his recent history, he was admitted to [facility] from 10/24/2017 through 10/30/2018 during which she had a rectourethral fistula repair with gracilis flap.  His hospitalization was complicated by a complicated UTI for which he was discharged home with ciprofloxacin.  He followed up with [facility] on 12/17/2018.  At that time, a suprapubic catheter was placed.  His urethral catheter was placed during the hospitalization in November 2019.  It has not been changed. In the emergency department, the patient was afebrile hemodynamically stable saturating 93-94% on room air.   WBC was 13.2.  BMP showed a sodium 128, serum creatinine 1.04 LFTs were unremarkable here.  Lactic acid was 3.2.  Assessment/Plan: Sepsis -Likely urinary source -present on admission -Continue vancomycin and aztreonam pending culture data -The patient has a rash/hives to ceftriaxone -Continue IV fluids -Check procalcitonin -INR 1.36, PTT 43 -UA 21-50 WBC, 11-20 RBC.  Submassive pulmonary emboli -CTA chest as discussed above -Continue IV heparin -Echocardiogram -Monitor H&H -Patient is currently stable on room air  Thrombocytopenia -Multifactorial including consumption from pulmonary embolus as well as sequestration from the patient splenomegaly -Monitor clinically for signs of worsening bleed  Coronary artery disease status post PCI 2011 -He has been off of aspirin and Effient for 2 months--he had consulted his cardiologist prior to his surgeries and procedures -No chest pain presently -Continue isosorbide mononitrate -Continue metoprolol tartrate  Essential hypertension -Holding HCTZ secondary to hyponatremia and soft blood pressure -Holding Inspra -Continue metoprolol tartrate  Diabetes mellitus type 2 -Hemoglobin A1c -Continue Lantus -NovoLog sliding scale -Holding metformin and Trulicity  Hyponatremia -Secondary to medications and liver cirrhosis -am BMP    Disposition Plan:   Home in 4 days  Family Communication: No  Family at bedside  Consultants:  none  Code Status:  FULL   DVT Prophylaxis:  IV Heparin    Procedures: As Listed in Progress Note Above  Antibiotics: vanco 1/18>>> Aztreonam 1/18>>>     Subjective: Patient still having small amount of hemoptysis.  He has pleuritic chest pain.  He states that this is mild to moderate and intermittent worsening with coughing he denies any nausea, vomiting, diarrhea, abdominal pain.  There is  no dysuria or hematuria.  Objective: Vitals:   12/28/18 1930 12/28/18 1945 12/28/18 2112 12/29/18 0452    BP:  127/65 108/77 114/71  Pulse: 77 74 72 62  Resp: 18 17 18 18   Temp:  98.7 F (37.1 C) 98.6 F (37 C) 97.7 F (36.5 C)  TempSrc:  Oral Oral Oral  SpO2: 94% 94% 93% 92%  Weight:   94.1 kg   Height:   5\' 10"  (1.778 m)     Intake/Output Summary (Last 24 hours) at 12/29/2018 0844 Last data filed at 12/29/2018 0457 Gross per 24 hour  Intake 1026.49 ml  Output 600 ml  Net 426.49 ml   Weight change:  Exam:   General:  Pt is alert, follows commands appropriately, not in acute distress  HEENT: No icterus, No thrush, No neck mass, [place]/AT  Cardiovascular: RRR, S1/S2, no rubs, no gallops  Respiratory: Bibasilar rales, right greater than left peer diminished breath sounds at right base.  No wheezing.  Abdomen: Soft/+BS, non tender, non distended, no guarding; perineum with 2 mm opening draining cloudy yellow fluid  Extremities: No edema, No lymphangitis, No petechiae, No rashes, no synovitis   Data Reviewed: I have personally reviewed following labs and imaging studies Basic Metabolic Panel: Recent Labs  Lab 12/28/18 1723 12/29/18 0712  NA 128* 132*  K 4.2 4.7  CL 93* 98  CO2 22 24  GLUCOSE 285* 322*  BUN 20 24*  CREATININE 1.04 1.02  CALCIUM 9.5 8.9   Liver Function Tests: Recent Labs  Lab 12/28/18 1723 12/29/18 0712  AST 26 19  ALT 22 20  ALKPHOS 60 54  BILITOT 1.3* 1.0  PROT 7.1 6.7  ALBUMIN 3.0* 2.7*   No results for input(s): LIPASE, AMYLASE in the last 168 hours. No results for input(s): AMMONIA in the last 168 hours. Coagulation Profile: Recent Labs  Lab 12/29/18 0027  INR 1.36   CBC: Recent Labs  Lab 12/28/18 1723 12/29/18 0712  WBC 15.2* 9.2  NEUTROABS 12.8*  --   HGB 12.2* 11.6*  HCT 37.3* 35.9*  MCV 89.2 91.8  PLT 131* 102*   Cardiac Enzymes: Recent Labs  Lab 12/28/18 1723 12/29/18 0027 12/29/18 0712  TROPONINI <0.03 <0.03 <0.03   BNP: Invalid input(s): POCBNP CBG: Recent Labs  Lab 12/29/18 0009 12/29/18 0747  GLUCAP  251* 309*   HbA1C: No results for input(s): HGBA1C in the last 72 hours. Urine analysis:    Component Value Date/Time   COLORURINE BROWN (A) 12/28/2018 1750   APPEARANCEUR HAZY (A) 12/28/2018 1750   LABSPEC >1.030 (H) 12/28/2018 1750   PHURINE 6.0 12/28/2018 1750   GLUCOSEU NEGATIVE 12/28/2018 1750   HGBUR LARGE (A) 12/28/2018 1750   BILIRUBINUR NEGATIVE 12/28/2018 1750   KETONESUR NEGATIVE 12/28/2018 1750   PROTEINUR 100 (A) 12/28/2018 1750   NITRITE POSITIVE (A) 12/28/2018 1750   LEUKOCYTESUR MODERATE (A) 12/28/2018 1750   Sepsis Labs: @LABRCNTIP (procalcitonin:4,lacticidven:4) ) Recent Results (from the past 240 hour(s))  Blood Culture (routine x 2)     Status: None (Preliminary result)   Collection Time: 12/28/18  5:36 PM  Result Value Ref Range Status   Specimen Description BLOOD LEFT HAND  Final   Special Requests   Final    BOTTLES DRAWN AEROBIC AND ANAEROBIC Blood Culture adequate volume   Culture   Final    NO GROWTH < 24 HOURS Performed at [facility], [address] [place], [place] [address]    Report Status PENDING  Incomplete  Blood  Culture (routine x 2)     Status: None (Preliminary result)   Collection Time: 12/28/18  6:47 PM  Result Value Ref Range Status   Specimen Description LEFT ANTECUBITAL  Final   Special Requests Blood Culture adequate volume  Final   Culture   Final    NO GROWTH < 12 HOURS Performed at [facility], [address] [place], [place] [address]    Report Status PENDING  Incomplete     Scheduled Meds: . calcium-vitamin D  1 tablet Oral BID  . ferrous sulfate  325 mg Oral BID  . hydrochlorothiazide  25 mg Oral Daily  . insulin aspart  0-9 Units Subcutaneous TID WC  . insulin glargine  128 Units Subcutaneous q morning - 10a  . isosorbide mononitrate  15 mg Oral BID  . Magnesium Carbonate  5 mL Oral QID  . metoprolol tartrate  25 mg Oral Daily  . multivitamin with minerals  1 tablet Oral Daily  . pantoprazole  40 mg Oral Daily    . spironolactone  25 mg Oral Daily   Continuous Infusions: . sodium chloride 1,000 mL (12/29/18 0332)  . aztreonam 2 g (12/29/18 0549)  . heparin 1,500 Units/hr (12/29/18 0050)  . vancomycin    . vancomycin      Procedures/Studies: Dg Chest 2 View  Result Date: 12/28/2018 CLINICAL DATA:  Cough and fever. EXAM: CHEST - 2 VIEW COMPARISON:  None. FINDINGS: The heart, hila, and mediastinum are normal. There is opacity in the right base. No other acute abnormalities. IMPRESSION: Right basilar opacity concerning for pneumonia given history. Recommend short-term follow-up chest x-ray to ensure resolution of the pulmonary opacity. No other acute abnormalities identified. Electronically Signed   By: Gerome Samavid  Williams III M.D   On: 12/28/2018 18:02   Ct Angio Chest Pe W And/or Wo Contrast  Result Date: 12/28/2018 CLINICAL DATA:  Surgery on November 14th to correct fistula. Suprapubic catheter. Colostomy bag. Right-sided flank pain with productive cough, hemoptysis. Ex-smoker. EXAM: CT ANGIOGRAPHY CHEST CT ABDOMEN AND PELVIS WITH CONTRAST TECHNIQUE: Multidetector CT imaging of the chest was performed using the standard protocol during bolus administration of intravenous contrast. Multiplanar CT image reconstructions and MIPs were obtained to evaluate the vascular anatomy. Multidetector CT imaging of the abdomen and pelvis was performed using the standard protocol during bolus administration of intravenous contrast. CONTRAST:  100mL ISOVUE-300 IOPAMIDOL (ISOVUE-300) INJECTION 61% COMPARISON:  Chest radiograph of earlier today FINDINGS: CTA CHEST FINDINGS Cardiovascular: The quality of this exam for evaluation of pulmonary embolism is sufficient. Large volume right lower lobe pulmonary emboli, including within the lobar and majority of segmental branches, including on image 47/5. Suspect mild right heart strain, as evidenced by an RA to RV ratio of 41/42 mm. Aortic and branch vessel atherosclerosis. Normal heart  size, without pericardial effusion. Multivessel coronary artery atherosclerosis. Mediastinum/Nodes: A node within the azygoesophageal recess is mildly enlarged at 11 mm on image 44/5. Right hilar node of 12 mm on image 38/5. Lungs/Pleura: Trace right pleural fluid or thickening. Mild degradation secondary to right arm position, not raised above the head. Patient body habitus degradation as well. Mild left base subsegmental atelectasis. Patchy right lower lobe airspace disease. Musculoskeletal: Bilateral moderate gynecomastia. No acute osseous abnormality. Review of the MIP images confirms the above findings. CT ABDOMEN and PELVIS FINDINGS Hepatobiliary: Mild cirrhosis, as evidenced by an irregular hepatic capsule and relative enlargement of the caudate lobe. No focal liver lesion. Normal gallbladder, without biliary ductal dilatation. Pancreas: Normal,  without mass or ductal dilatation. Spleen: Splenomegaly at 16.1 cm craniocaudal. Adrenals/Urinary Tract: Mild left adrenal thickening. Normal right adrenal gland. Normal kidneys, without hydronephrosis. Suprapubic catheter decompresses the urinary bladder. There is also Foley catheter which is incompletely imaged. Stomach/Bowel: Proximal gastric underdistention. Right-sided probable loop type colostomy. Normal small bowel caliber. Normal appendix. Normal small bowel caliber. Vascular/Lymphatic: Aortic and branch vessel atherosclerosis. Patent portal and splenic veins. No specific evidence of portal venous hypertension. No abdominopelvic adenopathy. Reproductive: Prostatectomy. Other: No significant free fluid. Small fat containing left inguinal hernia. Ventral abdominal wall laxity and a small fat containing right paramidline hernia. Musculoskeletal: No acute osseous abnormality. Review of the MIP images confirms the above findings. IMPRESSION: 1. Moderate to large volume right-sided pulmonary embolism. Positive for acute PE with CT evidence of right heart strain  (RV/LV Ratio = 0.97) consistent with at least submassive (intermediate risk) PE. The presence of right heart strain has been associated with an increased risk of morbidity and mortality. Please activate Code PE by paging [phone]. Left base atelectasis. Right base airspace disease could represent atelectasis and/or developing infarct. 2. No acute process in the abdomen or pelvis. 3. Cirrhosis and splenomegaly. 4. Coronary artery atherosclerosis. Aortic Atherosclerosis (ICD10-I70.0). 5. Bilateral gynecomastia. 6. Mild thoracic adenopathy, most likely reactive. These results will be called to the ordering clinician or representative by the Radiologist Assistant, and communication documented in the PACS or zVision Dashboard. Electronically Signed   By: Jeronimo Greaves M.D.   On: 12/28/2018 23:36   Ct Abdomen Pelvis W Contrast  Result Date: 12/28/2018 CLINICAL DATA:  Surgery on November 14th to correct fistula. Suprapubic catheter. Colostomy bag. Right-sided flank pain with productive cough, hemoptysis. Ex-smoker. EXAM: CT ANGIOGRAPHY CHEST CT ABDOMEN AND PELVIS WITH CONTRAST TECHNIQUE: Multidetector CT imaging of the chest was performed using the standard protocol during bolus administration of intravenous contrast. Multiplanar CT image reconstructions and MIPs were obtained to evaluate the vascular anatomy. Multidetector CT imaging of the abdomen and pelvis was performed using the standard protocol during bolus administration of intravenous contrast. CONTRAST:  ISOVUE-300 IOPAMIDOL (ISOVUE-300) INJECTION 61% COMPARISON:  Chest radiograph of earlier today FINDINGS: CTA CHEST FINDINGS Cardiovascular: The quality of this exam for evaluation of pulmonary embolism is sufficient. Large volume right lower lobe pulmonary emboli, including within the lobar and majority of segmental branches, including on image 47/5. Suspect mild right heart strain, as evidenced by an RA to RV ratio of 41/42 mm. Aortic and branch  vessel atherosclerosis. Normal heart size, without pericardial effusion. Multivessel coronary artery atherosclerosis. Mediastinum/Nodes: A node within the azygoesophageal recess is mildly enlarged at 11 mm on image 44/5. Right hilar node of 12 mm on image 38/5. Lungs/Pleura: Trace right pleural fluid or thickening. Mild degradation secondary to right arm position, not raised above the head. Patient body habitus degradation as well. Mild left base subsegmental atelectasis. Patchy right lower lobe airspace disease. Musculoskeletal: Bilateral moderate gynecomastia. No acute osseous abnormality. Review of the MIP images confirms the above findings. CT ABDOMEN and PELVIS FINDINGS Hepatobiliary: Mild cirrhosis, as evidenced by an irregular hepatic capsule and relative enlargement of the caudate lobe. No focal liver lesion. Normal gallbladder, without biliary ductal dilatation. Pancreas: Normal, without mass or ductal dilatation. Spleen: Splenomegaly at 16.1 cm craniocaudal. Adrenals/Urinary Tract: Mild left adrenal thickening. Normal right adrenal gland. Normal kidneys, without hydronephrosis. Suprapubic catheter decompresses the urinary bladder. There is also Foley catheter which is incompletely imaged. Stomach/Bowel: Proximal gastric underdistention. Right-sided probable loop type colostomy. Normal small bowel  caliber. Normal appendix. Normal small bowel caliber. Vascular/Lymphatic: Aortic and branch vessel atherosclerosis. Patent portal and splenic veins. No specific evidence of portal venous hypertension. No abdominopelvic adenopathy. Reproductive: Prostatectomy. Other: No significant free fluid. Small fat containing left inguinal hernia. Ventral abdominal wall laxity and a small fat containing right paramidline hernia. Musculoskeletal: No acute osseous abnormality. Review of the MIP images confirms the above findings. IMPRESSION: 1. Moderate to large volume right-sided pulmonary embolism. Positive for acute PE with  CT evidence of right heart strain (RV/LV Ratio = 0.97) consistent with at least submassive (intermediate risk) PE. The presence of right heart strain has been associated with an increased risk of morbidity and mortality. Please activate Code PE by paging [phone]. Left base atelectasis. Right base airspace disease could represent atelectasis and/or developing infarct. 2. No acute process in the abdomen or pelvis. 3. Cirrhosis and splenomegaly. 4. Coronary artery atherosclerosis. Aortic Atherosclerosis (ICD10-I70.0). 5. Bilateral gynecomastia. 6. Mild thoracic adenopathy, most likely reactive. These results will be called to the ordering clinician or representative by the Radiologist Assistant, and communication documented in the PACS or zVision Dashboard. Electronically Signed   By: Jeronimo GreavesKyle  Talbot M.D.   On: 12/28/2018 23:36    Catarina Hartshornavid Marcey Persad, DO  [facility] Pager [phone]  If 7PM-7AM, please contact night-coverage [email or web address] Password [email or web address] 12/29/2018, 8:44 AM   LOS: 1 day

## 2018-12-29 NOTE — Progress Notes (Signed)
ANTICOAGULATION CONSULT NOTE - Initial Consult  Pharmacy Consult for heparin Indication: pulmonary embolus  Allergies  Allergen Reactions  . Ace Inhibitors Swelling  . Amlodipine Swelling  . Ceftriaxone Hives and Other (See Comments)    Reaction is unknown to patient. Possible reaction resulting in hives   . Nsaids Other (See Comments)    Stomach pain  . Phenergan [Promethazine Hcl] Other (See Comments)    hallucinations  . Versed [Midazolam] Nausea And Vomiting  . Adhesive [Tape] Rash    Skin irritation  . Contrast Media [Iodinated Diagnostic Agents] Rash  . Latex Rash    Skin irritation-severe    Patient Measurements: Height: 5\' 10"  (177.8 cm) Weight: 207 lb 7.3 oz (94.1 kg) IBW/kg (Calculated) : 73 Heparin Dosing Weight: 92 kg  Vital Signs: Temp: 97.7 F (36.5 C) (01/19 0452) Temp Source: Oral (01/19 0452) BP: 114/71 (01/19 0452) Pulse Rate: 62 (01/19 0452)  Labs: Recent Labs    12/28/18 1723 12/29/18 0027 12/29/18 0712  HGB 12.2*  --   --   HCT 37.3*  --   --   PLT 131*  --   --   APTT  --  43*  --   LABPROT  --  16.7*  --   INR  --  1.36  --   HEPARINUNFRC  --   --  0.36  CREATININE 1.04  --   --   TROPONINI <0.03 <0.03  --     Estimated Creatinine Clearance: 81.5 mL/min (by C-G formula based on SCr of 1.04 mg/dL).   Medical History: Past Medical History:  Diagnosis Date  . Diabetes mellitus without complication (HCC)    type 2    Medications:  Medications Prior to Admission  Medication Sig Dispense Refill Last Dose  . ascorbic acid (VITAMIN C) 1000 MG tablet Take 1,000 mg by mouth 2 (two) times daily.   12/28/2018 at Unknown time  . aspirin EC 81 MG tablet Take 81 mg by mouth daily.   Past Month at Unknown time  . calcium citrate-vitamin D (CITRACAL+D) 315-200 MG-UNIT tablet Take 1 tablet by mouth 2 (two) times daily.   12/28/2018 at Unknown time  . Dulaglutide (TRULICITY) 0.75 MG/0.5ML SOPN Inject 0.75 mg into the skin every Sunday.    12/22/2018 at Unknown time  . eplerenone (INSPRA) 50 MG tablet Take 50 mg by mouth every morning.   12/28/2018 at Unknown time  . ferrous sulfate 325 (65 FE) MG tablet Take 325 mg by mouth 2 (two) times daily.   12/28/2018 at Unknown time  . hydrochlorothiazide (HYDRODIURIL) 25 MG tablet Take 25 mg by mouth daily.   12/28/2018 at Unknown time  . HYDROmorphone (DILAUDID) 2 MG tablet Take 2 mg by mouth every 4 (four) hours.   12/28/2018 at 1500  . insulin aspart (NOVOLOG FLEXPEN) 100 UNIT/ML FlexPen Inject 12 Units into the skin 3 (three) times daily with meals. Add 2 units for every level of 50 over as directed by physician per sliding scale   12/28/2018 at Unknown time  . Insulin Degludec (TRESIBA FLEXTOUCH) 200 UNIT/ML SOPN Inject 128 Units into the skin every morning.   12/28/2018 at Unknown time  . isosorbide mononitrate (ISMO,MONOKET) 10 MG tablet Take 15 mg by mouth 2 (two) times daily.   12/28/2018 at Unknown time  . loperamide (IMODIUM A-D) 2 MG tablet Take 2 mg by mouth 4 (four) times daily as needed for diarrhea or loose stools.   12/28/2018 at 1500  . Magnesium Carbonate (  MAGONATE) 54 (Mag Equiv) MG/5ML LIQD Take 5 mLs by mouth 4 (four) times daily.   12/28/2018 at Unknown time  . metFORMIN (GLUCOPHAGE) 1000 MG tablet Take 1,000 mg by mouth 2 (two) times daily with a meal.   12/28/2018 at Unknown time  . metoprolol tartrate (LOPRESSOR) 25 MG tablet Take 25 mg by mouth daily.   12/28/2018 at 1500  . Multiple Vitamin (MULTIVITAMIN WITH MINERALS) TABS tablet Take 1 tablet by mouth daily.   12/28/2018 at Unknown time  . nystatin (MYCOSTATIN/NYSTOP) powder Apply topically 2 (two) times daily as needed (for redness/irritation).   Past Week at Unknown time  . omeprazole (PRILOSEC) 20 MG capsule Take 20 mg by mouth every morning.   12/28/2018 at Unknown time  . OVER THE COUNTER MEDICATION Take 1,350 mg by mouth 2 (two) times daily. [place]   12/28/2018 at Unknown time  . oxybutynin (DITROPAN) 5 MG tablet  Take 5 mg by mouth 3 (three) times daily as needed for bladder spasms.   12/28/2018 at Unknown time  . prasugrel (EFFIENT) 5 MG TABS tablet Take 5 mg by mouth daily.   HOLD  . Vitamin D, Ergocalciferol, (DRISDOL) 1.25 MG (50000 UT) CAPS capsule Take 50,000 Units by mouth 2 (two) times a week. Mondays and Thursdays   12/26/2018 at Unknown time  . zinc gluconate 50 MG tablet Take 50 mg by mouth 2 (two) times daily.   12/28/2018 at Unknown time    Assessment: 68 yo male presented with worsening cough and fever.  Chest x-ray showed possible RLL pneumonia.  CTA showed moderate large volume right sided pulmonary embolism.  Starting heparin.  No anticoags at home.  Heparin level therapeutic at 0.36  Goal of Therapy:  Heparin level 0.3-0.7 units/ml Monitor platelets by anticoagulation protocol: Yes   Plan:  Continue heparin infusion at 1500 units/hr Check anti-Xa level daily while on heparin Continue to monitor H&H and platelets  Judeth Cornfield, PharmD Clinical Pharmacist 12/29/2018 7:36 AM

## 2018-12-29 NOTE — Progress Notes (Signed)
ANTICOAGULATION CONSULT NOTE - Preliminary  Pharmacy Consult for heparin Indication: pulmonary embolus  Allergies  Allergen Reactions  . Ace Inhibitors Swelling  . Amlodipine Swelling  . Ceftriaxone Hives and Other (See Comments)    Reaction is unknown to patient. Possible reaction resulting in hives   . Nsaids Other (See Comments)    Stomach pain  . Phenergan [Promethazine Hcl] Other (See Comments)    hallucinations  . Versed [Midazolam] Nausea And Vomiting  . Adhesive [Tape] Rash    Skin irritation  . Contrast Media [Iodinated Diagnostic Agents] Rash  . Latex Rash    Skin irritation-severe    Patient Measurements:  Height: 5\' 10"  (177.8 cm) Weight: 207 lb 7.3 oz (94.1 kg) IBW/kg (Calculated) : 73 HEPARIN DW (KG): 92.1   Vital Signs: Temp: 98.6 F (37 C) (01/18 2112) Temp Source: Oral (01/18 2112) BP: 108/77 (01/18 2112) Pulse Rate: 72 (01/18 2112)  Labs: Recent Labs    12/28/18 1723  HGB 12.2*  HCT 37.3*  PLT 131*  CREATININE 1.04  TROPONINI <0.03   Estimated Creatinine Clearance: 81.5 mL/min (by C-G formula based on SCr of 1.04 mg/dL).  Medical History: Past Medical History:  Diagnosis Date  . Diabetes mellitus without complication (HCC)    type 2    Medications:  Scheduled:  . calcium-vitamin D  1 tablet Oral BID  . ferrous sulfate  325 mg Oral BID  . heparin  5,000 Units Intravenous Once  . hydrochlorothiazide  25 mg Oral Daily  . insulin aspart  0-9 Units Subcutaneous TID WC  . insulin glargine  128 Units Subcutaneous q morning - 10a  . isosorbide mononitrate  15 mg Oral BID  . Magnesium Carbonate  5 mL Oral QID  . metoprolol tartrate  25 mg Oral Daily  . multivitamin with minerals  1 tablet Oral Daily  . pantoprazole  40 mg Oral Daily  . spironolactone  25 mg Oral Daily   Infusions:  . sodium chloride 1,000 mL (12/28/18 1941)  . aztreonam    . heparin    . vancomycin    . vancomycin      Assessment: 68 yo male presented with  worsening cough and fever.  Chest x-ray showed possible RLL pneumonia.  CTA showed moderate large volume right sided pulmonary embolism.  Starting heparin.  No anticoags at home Labs pending  Goal of Therapy:  Heparin level 0.3-0.7 units/ml   Plan:  Give 5000 units bolus x 1 Start heparin infusion at 1500 units/hr Check anti-Xa level in 6 hours and daily while on heparin Continue to monitor H&H and platelets Preliminary review of pertinent patient information completed.  Jeani Hawking clinical pharmacist will complete review during morning rounds to assess the patient and finalize treatment regimen.  Loyola Mast, [facility] 12/29/2018,12:08 AM

## 2018-12-29 NOTE — Progress Notes (Addendum)
Patient CTA chest showed moderate large volume right-sided pulmonary embolism Discussed CT results with E link to Dr. Darrick Penna, who discussed patient's condition.  Patient is hemodynamically stable, not requiring oxygen.  O2 sats 94% on room air.  She recommends getting 2D echo, cycling troponin every 6 hours x3 and starting heparin.  No need for transfer to [facility] at this time.

## 2018-12-29 NOTE — Progress Notes (Signed)
*  PRELIMINARY RESULTS* Echocardiogram 2D Echocardiogram has been performed.  Stacey Drain 12/29/2018, 11:06 AM

## 2018-12-30 DIAGNOSIS — A419 Sepsis, unspecified organism: Secondary | ICD-10-CM

## 2018-12-30 LAB — HEPARIN LEVEL (UNFRACTIONATED): Heparin Unfractionated: 0.51 IU/mL (ref 0.30–0.70)

## 2018-12-30 LAB — GLUCOSE, CAPILLARY: Glucose-Capillary: 93 mg/dL (ref 70–99)

## 2018-12-30 LAB — CBC
HCT: 33.2 % — ABNORMAL LOW (ref 39.0–52.0)
Hemoglobin: 10.6 g/dL — ABNORMAL LOW (ref 13.0–17.0)
MCH: 28.7 pg (ref 26.0–34.0)
MCHC: 31.9 g/dL (ref 30.0–36.0)
MCV: 90 fL (ref 80.0–100.0)
Platelets: 136 10*3/uL — ABNORMAL LOW (ref 150–400)
RBC: 3.69 MIL/uL — ABNORMAL LOW (ref 4.22–5.81)
RDW: 12.4 % (ref 11.5–15.5)
WBC: 10.5 10*3/uL (ref 4.0–10.5)
nRBC: 0 % (ref 0.0–0.2)

## 2018-12-30 LAB — BASIC METABOLIC PANEL
Anion gap: 10 (ref 5–15)
BUN: 31 mg/dL — ABNORMAL HIGH (ref 8–23)
CO2: 23 mmol/L (ref 22–32)
Calcium: 8.8 mg/dL — ABNORMAL LOW (ref 8.9–10.3)
Chloride: 101 mmol/L (ref 98–111)
Creatinine, Ser: 0.93 mg/dL (ref 0.61–1.24)
GFR calc Af Amer: >60 mL/min (ref >60)
GFR calc non Af Amer: >60 mL/min (ref >60)
GLUCOSE: 92 mg/dL (ref 70–99)
Potassium: 3.5 mmol/L (ref 3.5–5.1)
Sodium: 134 mmol/L — ABNORMAL LOW (ref 135–145)

## 2018-12-30 LAB — MAGNESIUM: Magnesium: 1.6 mg/dL — ABNORMAL LOW (ref 1.7–2.4)

## 2018-12-30 LAB — PROCALCITONIN: Procalcitonin: 0.27 ng/mL

## 2018-12-30 LAB — HIV ANTIBODY (ROUTINE TESTING W REFLEX): HIV SCREEN 4TH GENERATION: NONREACTIVE

## 2018-12-30 LAB — MRSA PCR SCREENING: MRSA by PCR: NEGATIVE

## 2018-12-30 MED ORDER — HYDROMORPHONE HCL 2 MG PO TABS
2.0000 mg | ORAL_TABLET | Freq: Once | ORAL | Status: DC
  Filled 2018-12-30: qty 1

## 2018-12-30 MED ORDER — METHOCARBAMOL 1000 MG/10ML IJ SOLN: 500.0000 mg | Freq: Once | INTRAVENOUS | Status: DC

## 2018-12-30 MED ORDER — MAGNESIUM OXIDE 400 (241.3 MG) MG PO TABS
400.0000 mg | ORAL_TABLET | Freq: Every day | ORAL | Status: DC
  Administered 2018-12-30 – 2019-01-01 (×3): 400 mg via ORAL
  Filled 2018-12-30 (×3): qty 1

## 2018-12-30 MED ORDER — PHENAZOPYRIDINE HCL 100 MG PO TABS
200.0000 mg | ORAL_TABLET | Freq: Three times a day (TID) | ORAL | Status: DC | PRN
  Administered 2018-12-30 – 2018-12-31 (×2): 200 mg via ORAL
  Filled 2018-12-30 (×2): qty 2

## 2018-12-30 MED ORDER — DIPHENOXYLATE-ATROPINE 2.5-0.025 MG PO TABS
1.0000 | ORAL_TABLET | Freq: Once | ORAL | Status: AC
  Administered 2018-12-30: 1 via ORAL
  Filled 2018-12-30: qty 1

## 2018-12-30 MED ORDER — HYDROMORPHONE HCL 2 MG PO TABS
2.0000 mg | ORAL_TABLET | Freq: Once | ORAL | Status: AC
  Administered 2018-12-30: 2 mg via ORAL

## 2018-12-30 MED ORDER — METOPROLOL TARTRATE 25 MG PO TABS
12.5000 mg | ORAL_TABLET | Freq: Every day | ORAL | Status: DC
  Administered 2018-12-31 – 2019-01-01 (×2): 12.5 mg via ORAL
  Filled 2018-12-30 (×2): qty 1

## 2018-12-30 NOTE — Progress Notes (Signed)
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  The patient had been complaining of pleuritic type chest pain during this period of time associated with his coughing.  He states that he is coughing up approximately half teaspoon of blood.  As result, he presented for further evaluation.  He has had fevers up to 1 1.9 F.  He denied any headache, neck pain, nausea, vomiting, diarrhea, hematochezia, melena.  Remarkably, the patient states that he was not particularly short of breath.  Upon presentation, initial chest x-ray suggested right lower lobe opacity.  CT angiogram of the chest showed large volume right lower lobe pulmonary emboli with reactive right hilar lymphadenopathy.  There is patchy right lower lobe airspace disease.  CT of the abdomen and pelvis did not show any acute findings.  He did have a cirrhotic appearing liver with some splenomegaly.  The patient was started on intravenous heparin, vancomycin, and aztreonam. Regarding his recent history, he was admitted to [facility] from 10/24/2017 through 10/30/2018 during which she had a rectourethral fistula repair with gracilis flap.  His hospitalization was complicated by a complicated UTI for which he was discharged home with ciprofloxacin.  He followed up with [facility] on 12/17/2018.  At that time, a suprapubic catheter was placed.  His urethral catheter was placed during the hospitalization in November 2019.  It has not been changed. In the emergency department, the patient was afebrile hemodynamically stable saturating 93-94% on room air.   WBC was 13.2.  BMP showed a sodium 128, serum creatinine 1.04 LFTs were unremarkable here.  Lactic acid was 3.2.  Assessment/Plan: Sepsis -Likely urinary source -present on admission -Continue vancomycin and aztreonam pending culture data -The patient has a rash/hives to ceftriaxone -Continue IV fluids>>> saline lock -Check procalcitonin--.26 -INR 1.36, PTT 43 -UA 21-50 WBC, 11-20 RBC.  Submassive pulmonary emboli -CTA chest as discussed above -Continue IV heparin -Echocardiogram--EF 65-70%, no WMA, grade 1 DD, acute function, normal IVC -Monitor H&H -Patient is currently stable on room air  UTI/catheter associated UTI -E. coli on preliminary culture -Continue aztreonam  Thrombocytopenia -Multifactorial including consumption from pulmonary embolus as well as sequestration from the patient splenomegaly -Monitor clinically for signs of worsening bleed  Hemoptysis -Approximately half teaspoons worth each time -Monitor clinically -Secondary to pulmonary infarct -Repeat chest x-ray  Coronary artery disease status post PCI 2011 -He has been off of aspirin and Effient for 2 months--he had consulted his cardiologist prior to his surgeries and procedures -No chest pain presently -Continue isosorbide mononitrate -Continue metoprolol tartrate  Essential hypertension -Holding HCTZ secondary to hyponatremia and soft blood pressure -Holding Inspra -Decrease metoprolol tartrate due to soft blood pressure  Diabetes mellitus type 2 uncontrolled with hyperglycemia -Hemoglobin A1c--8.1 -Continue Lantus -NovoLog sliding scale -Holding metformin and Trulicity  Hyponatremia -Secondary to medications and liver cirrhosis -am BMP    Disposition Plan:   Home in 2-3  days if hemoptysis improves Family Communication: spouse update at bedside 12/2018  Consultants:  none  Code Status:  FULL   DVT Prophylaxis:  IV Heparin    Procedures: As Listed in Progress  Note  Above  Antibiotics: vanco 1/18>>> Aztreonam 1/18>>>  Total time spent 35 minutes.  Greater than 50% spent face to face counseling and coordinating care.    Subjective: He states that his hemoptysis is about the same as yesterday but remains a small amount.  He denies any nausea, vomiting, diarrhea, abdominal pain, dysuria, hematuria.  There is no hematochezia melena.  No hematuria.  There is no hematemesis.  He denies any fevers or chills.  He is able to walk with a mild amount of shortness of breath.  His pleuritic chest pain is improving.  Objective: Vitals:   12/29/18 1310 12/29/18 2100 12/30/18 0651 12/30/18 1431  BP: 111/66 109/77 105/66 (!) 96/59  Pulse: (!) 56 81 60 (!) 56  Resp: 18 19 18 20   Temp: 98.3 F (36.8 C) 98.4 F (36.9 C) 97.7 F (36.5 C)   TempSrc: Oral Oral Oral   SpO2: 93% 96% 95% 95%  Weight:      Height:        Intake/Output Summary (Last 24 hours) at 12/30/2018 1507 Last data filed at 12/30/2018 0900 Gross per 24 hour  Intake 1095 ml  Output -  Net 1095 ml   Weight change:  Exam:   General:  Pt is alert, follows commands appropriately, not in acute distress  HEENT: No icterus, No thrush, No neck mass, [place]/AT  Cardiovascular: RRR, S1/S2, no rubs, no gallops  Respiratory: Bibasilar crackles, regular (no wheezing.  Air movement.  Abdomen: Soft/+BS, non tender, non distended, no guarding  Extremities: No edema, No lymphangitis, No petechiae, No rashes, no synovitis   Data Reviewed: I have personally reviewed following labs and imaging studies Basic Metabolic Panel: Recent Labs  Lab 12/28/18 1723 12/29/18 0712 12/30/18 0746  NA 128* 132* 134*  K 4.2 4.7 3.5  CL 93* 98 101  CO2 22 24 23   GLUCOSE 285* 322* 92  BUN 20 24* 31*  CREATININE 1.04 1.02 0.93  CALCIUM 9.5 8.9 8.8*  MG  --   --  1.6*   Liver Function Tests: Recent Labs  Lab 12/28/18 1723 12/29/18 0712  AST 26 19  ALT 22 20  ALKPHOS 60 54  BILITOT 1.3* 1.0  PROT 7.1  6.7  ALBUMIN 3.0* 2.7*   No results for input(s): LIPASE, AMYLASE in the last 168 hours. No results for input(s): AMMONIA in the last 168 hours. Coagulation Profile: Recent Labs  Lab 12/29/18 0027  INR 1.36   CBC: Recent Labs  Lab 12/28/18 1723 12/29/18 0712 12/30/18 0746  WBC 15.2* 9.2 10.5  NEUTROABS 12.8*  --   --   HGB 12.2* 11.6* 10.6*  HCT 37.3* 35.9* 33.2*  MCV 89.2 91.8 90.0  PLT 131* 102* 136*   Cardiac Enzymes: Recent Labs  Lab 12/28/18 1723 12/29/18 0027 12/29/18 0712 12/29/18 1409  TROPONINI <0.03 <0.03 <0.03 <0.03   BNP: Invalid input(s): POCBNP CBG: Recent Labs  Lab 12/29/18 0747 12/29/18 1130 12/29/18 1610 12/29/18 2148 12/30/18 0731  GLUCAP 309* 354* 262* 199* 93   HbA1C: Recent Labs    12/29/18 0712  HGBA1C 8.1*   Urine analysis:    Component Value Date/Time   COLORURINE BROWN (A) 12/28/2018 1750   APPEARANCEUR HAZY (A) 12/28/2018 1750   LABSPEC >1.030 (H) 12/28/2018 1750   PHURINE 6.0 12/28/2018 1750   GLUCOSEU NEGATIVE 12/28/2018 1750   HGBUR LARGE (A) 12/28/2018 1750   BILIRUBINUR NEGATIVE 12/28/2018 1750   KETONESUR NEGATIVE 12/28/2018 1750   PROTEINUR 100 (A) 12/28/2018 1750  NITRITE POSITIVE (A) 12/28/2018 1750   LEUKOCYTESUR MODERATE (A) 12/28/2018 1750   Sepsis Labs: @LABRCNTIP (procalcitonin:4,lacticidven:4) ) Recent Results (from the past 240 hour(s))  Blood Culture (routine x 2)     Status: None (Preliminary result)   Collection Time: 12/28/18  5:36 PM  Result Value Ref Range Status   Specimen Description BLOOD LEFT HAND  Final   Special Requests   Final    BOTTLES DRAWN AEROBIC AND ANAEROBIC Blood Culture adequate volume   Culture   Final    NO GROWTH 2 DAYS Performed at [facility], [address] [place], [place] [address]    Report Status PENDING  Incomplete  Urine culture     Status: Abnormal (Preliminary result)   Collection Time: 12/28/18  5:50 PM  Result Value Ref Range Status   Specimen  Description   Final    URINE, SUPRAPUBIC Performed at [facility], [address] [place], [place] [address]    Special Requests   Final    NONE Performed at [facility], [address] [place], [place] [address]    Culture >=100,000 COLONIES/mL ESCHERICHIA COLI (A)  Final   Report Status PENDING  Incomplete  Blood Culture (routine x 2)     Status: None (Preliminary result)   Collection Time: 12/28/18  6:47 PM  Result Value Ref Range Status   Specimen Description LEFT ANTECUBITAL  Final   Special Requests   Final    BOTTLES DRAWN AEROBIC AND ANAEROBIC Blood Culture adequate volume   Culture   Final    NO GROWTH 2 DAYS Performed at [facility], [address] [place], [place] [address]    Report Status PENDING  Incomplete     Scheduled Meds: . benzonatate  200 mg Oral TID  . calcium-vitamin D  1 tablet Oral BID  . ferrous sulfate  325 mg Oral BID  . insulin aspart  0-9 Units Subcutaneous TID WC  . insulin glargine  128 Units Subcutaneous q morning - 10a  . isosorbide mononitrate  15 mg Oral BID  . magnesium oxide  400 mg Oral Daily  . [START ON 12/31/2018] metoprolol tartrate  12.5 mg Oral Daily  . multivitamin with minerals  1 tablet Oral Daily  . pantoprazole  40 mg Oral Daily   Continuous Infusions: . aztreonam 2 g (12/30/18 [id number])  . heparin 1,500 Units/hr (12/30/18 1245)  . methocarbamol (ROBAXIN) IV    . vancomycin 1,250 mg (12/30/18 1047)  . vancomycin      Procedures/Studies: Dg Chest 2 View  Result Date: 12/28/2018 CLINICAL DATA:  Cough and fever. EXAM: CHEST - 2 VIEW COMPARISON:  None. FINDINGS: The heart, hila, and mediastinum are normal. There is opacity in the right base. No other acute abnormalities. IMPRESSION: Right basilar opacity concerning for pneumonia given history. Recommend short-term follow-up chest x-ray to ensure resolution of the pulmonary opacity. No other acute abnormalities identified. Electronically Signed   By: Gerome Samavid  Williams III M.D    On: 12/28/2018 18:02   Ct Angio Chest Pe W And/or Wo Contrast  Result Date: 12/28/2018 CLINICAL DATA:  Surgery on November 14th to correct fistula. Suprapubic catheter. Colostomy bag. Right-sided flank pain with productive cough, hemoptysis. Ex-smoker. EXAM: CT ANGIOGRAPHY CHEST CT ABDOMEN AND PELVIS WITH CONTRAST TECHNIQUE: Multidetector CT imaging of the chest was performed using the standard protocol during bolus administration of intravenous contrast. Multiplanar CT image reconstructions and MIPs were obtained to evaluate the vascular anatomy. Multidetector CT imaging of the abdomen and pelvis  was performed using the standard protocol during bolus administration of intravenous contrast. CONTRAST:  100mL ISOVUE-300 IOPAMIDOL (ISOVUE-300) INJECTION 61% COMPARISON:  Chest radiograph of earlier today FINDINGS: CTA CHEST FINDINGS Cardiovascular: The quality of this exam for evaluation of pulmonary embolism is sufficient. Large volume right lower lobe pulmonary emboli, including within the lobar and majority of segmental branches, including on image 47/5. Suspect mild right heart strain, as evidenced by an RA to RV ratio of 41/42 mm. Aortic and branch vessel atherosclerosis. Normal heart size, without pericardial effusion. Multivessel coronary artery atherosclerosis. Mediastinum/Nodes: A node within the azygoesophageal recess is mildly enlarged at 11 mm on image 44/5. Right hilar node of 12 mm on image 38/5. Lungs/Pleura: Trace right pleural fluid or thickening. Mild degradation secondary to right arm position, not raised above the head. Patient body habitus degradation as well. Mild left base subsegmental atelectasis. Patchy right lower lobe airspace disease. Musculoskeletal: Bilateral moderate gynecomastia. No acute osseous abnormality. Review of the MIP images confirms the above findings. CT ABDOMEN and PELVIS FINDINGS Hepatobiliary: Mild cirrhosis, as evidenced by an irregular hepatic capsule and relative  enlargement of the caudate lobe. No focal liver lesion. Normal gallbladder, without biliary ductal dilatation. Pancreas: Normal, without mass or ductal dilatation. Spleen: Splenomegaly at 16.1 cm craniocaudal. Adrenals/Urinary Tract: Mild left adrenal thickening. Normal right adrenal gland. Normal kidneys, without hydronephrosis. Suprapubic catheter decompresses the urinary bladder. There is also Foley catheter which is incompletely imaged. Stomach/Bowel: Proximal gastric underdistention. Right-sided probable loop type colostomy. Normal small bowel caliber. Normal appendix. Normal small bowel caliber. Vascular/Lymphatic: Aortic and branch vessel atherosclerosis. Patent portal and splenic veins. No specific evidence of portal venous hypertension. No abdominopelvic adenopathy. Reproductive: Prostatectomy. Other: No significant free fluid. Small fat containing left inguinal hernia. Ventral abdominal wall laxity and a small fat containing right paramidline hernia. Musculoskeletal: No acute osseous abnormality. Review of the MIP images confirms the above findings. IMPRESSION: 1. Moderate to large volume right-sided pulmonary embolism. Positive for acute PE with CT evidence of right heart strain (RV/LV Ratio = 0.97) consistent with at least submassive (intermediate risk) PE. The presence of right heart strain has been associated with an increased risk of morbidity and mortality. Please activate Code PE by paging [phone]. Left base atelectasis. Right base airspace disease could represent atelectasis and/or developing infarct. 2. No acute process in the abdomen or pelvis. 3. Cirrhosis and splenomegaly. 4. Coronary artery atherosclerosis. Aortic Atherosclerosis (ICD10-I70.0). 5. Bilateral gynecomastia. 6. Mild thoracic adenopathy, most likely reactive. These results will be called to the ordering clinician or representative by the Radiologist Assistant, and communication documented in the PACS or zVision Dashboard.  Electronically Signed   By: Jeronimo GreavesKyle  Talbot M.D.   On: 12/28/2018 23:36   Ct Abdomen Pelvis W Contrast  Result Date: 12/28/2018 CLINICAL DATA:  Surgery on November 14th to correct fistula. Suprapubic catheter. Colostomy bag. Right-sided flank pain with productive cough, hemoptysis. Ex-smoker. EXAM: CT ANGIOGRAPHY CHEST CT ABDOMEN AND PELVIS WITH CONTRAST TECHNIQUE: Multidetector CT imaging of the chest was performed using the standard protocol during bolus administration of intravenous contrast. Multiplanar CT image reconstructions and MIPs were obtained to evaluate the vascular anatomy. Multidetector CT imaging of the abdomen and pelvis was performed using the standard protocol during bolus administration of intravenous contrast. CONTRAST:  100mL ISOVUE-300 IOPAMIDOL (ISOVUE-300) INJECTION 61% COMPARISON:  Chest radiograph of earlier today FINDINGS: CTA CHEST FINDINGS Cardiovascular: The quality of this exam for evaluation of pulmonary embolism is sufficient. Large volume right lower lobe pulmonary emboli, including  within the lobar and majority of segmental branches, including on image 47/5. Suspect mild right heart strain, as evidenced by an RA to RV ratio of 41/42 mm. Aortic and branch vessel atherosclerosis. Normal heart size, without pericardial effusion. Multivessel coronary artery atherosclerosis. Mediastinum/Nodes: A node within the azygoesophageal recess is mildly enlarged at 11 mm on image 44/5. Right hilar node of 12 mm on image 38/5. Lungs/Pleura: Trace right pleural fluid or thickening. Mild degradation secondary to right arm position, not raised above the head. Patient body habitus degradation as well. Mild left base subsegmental atelectasis. Patchy right lower lobe airspace disease. Musculoskeletal: Bilateral moderate gynecomastia. No acute osseous abnormality. Review of the MIP images confirms the above findings. CT ABDOMEN and PELVIS FINDINGS Hepatobiliary: Mild cirrhosis, as evidenced by an  irregular hepatic capsule and relative enlargement of the caudate lobe. No focal liver lesion. Normal gallbladder, without biliary ductal dilatation. Pancreas: Normal, without mass or ductal dilatation. Spleen: Splenomegaly at 16.1 cm craniocaudal. Adrenals/Urinary Tract: Mild left adrenal thickening. Normal right adrenal gland. Normal kidneys, without hydronephrosis. Suprapubic catheter decompresses the urinary bladder. There is also Foley catheter which is incompletely imaged. Stomach/Bowel: Proximal gastric underdistention. Right-sided probable loop type colostomy. Normal small bowel caliber. Normal appendix. Normal small bowel caliber. Vascular/Lymphatic: Aortic and branch vessel atherosclerosis. Patent portal and splenic veins. No specific evidence of portal venous hypertension. No abdominopelvic adenopathy. Reproductive: Prostatectomy. Other: No significant free fluid. Small fat containing left inguinal hernia. Ventral abdominal wall laxity and a small fat containing right paramidline hernia. Musculoskeletal: No acute osseous abnormality. Review of the MIP images confirms the above findings. IMPRESSION: 1. Moderate to large volume right-sided pulmonary embolism. Positive for acute PE with CT evidence of right heart strain (RV/LV Ratio = 0.97) consistent with at least submassive (intermediate risk) PE. The presence of right heart strain has been associated with an increased risk of morbidity and mortality. Please activate Code PE by paging [phone]. Left base atelectasis. Right base airspace disease could represent atelectasis and/or developing infarct. 2. No acute process in the abdomen or pelvis. 3. Cirrhosis and splenomegaly. 4. Coronary artery atherosclerosis. Aortic Atherosclerosis (ICD10-I70.0). 5. Bilateral gynecomastia. 6. Mild thoracic adenopathy, most likely reactive. These results will be called to the ordering clinician or representative by the Radiologist Assistant, and communication documented  in the PACS or zVision Dashboard. Electronically Signed   By: Jeronimo Greaves M.D.   On: 12/28/2018 23:36    Catarina Hartshorn, DO  [facility] Pager [phone]  If 7PM-7AM, please contact night-coverage [email or web address] Password [email or web address] 12/30/2018, 3:07 PM   LOS: 2 days

## 2018-12-30 NOTE — Progress Notes (Signed)
Patients CBG 314. Failed to transfer to Epic. Mid Level notified.

## 2018-12-30 NOTE — Progress Notes (Signed)
ANTICOAGULATION CONSULT NOTE   Pharmacy Consult for heparin Indication: pulmonary embolus  Allergies  Allergen Reactions  . Ace Inhibitors Swelling  . Amlodipine Swelling  . Ceftriaxone Hives and Other (See Comments)    Reaction is unknown to patient. Possible reaction resulting in hives   . Nsaids Other (See Comments)    Stomach pain  . Phenergan [Promethazine Hcl] Other (See Comments)    hallucinations  . Versed [Midazolam] Nausea And Vomiting  . Adhesive [Tape] Rash    Skin irritation  . Contrast Media [Iodinated Diagnostic Agents] Rash  . Latex Rash    Skin irritation-severe    Patient Measurements: Height: 5\' 10"  (177.8 cm) Weight: 207 lb 7.3 oz (94.1 kg) IBW/kg (Calculated) : 73 Heparin Dosing Weight: 92 kg  Vital Signs: Temp: 97.7 F (36.5 C) (01/20 0651) Temp Source: Oral (01/20 0651) BP: 105/66 (01/20 0651) Pulse Rate: 60 (01/20 0651)  Labs: Recent Labs    12/28/18 1723 12/29/18 0027 12/29/18 0712 12/29/18 1409 12/30/18 0746 12/30/18 0852  HGB 12.2*  --  11.6*  --  10.6*  --   HCT 37.3*  --  35.9*  --  33.2*  --   PLT 131*  --  102*  --  136*  --   APTT  --  43*  --   --   --   --   LABPROT  --  16.7*  --   --   --   --   INR  --  1.36  --   --   --   --   HEPARINUNFRC  --   --  0.36  --   --  0.51  CREATININE 1.04  --  1.02  --  0.93  --   TROPONINI <0.03 <0.03 <0.03 <0.03  --   --     Estimated Creatinine Clearance: 91.2 mL/min (by C-G formula based on SCr of 0.93 mg/dL).   Medical History: Past Medical History:  Diagnosis Date  . Diabetes mellitus without complication (HCC)    type 2    Medications:  Medications Prior to Admission  Medication Sig Dispense Refill Last Dose  . ascorbic acid (VITAMIN C) 1000 MG tablet Take 1,000 mg by mouth 2 (two) times daily.   12/28/2018 at Unknown time  . aspirin EC 81 MG tablet Take 81 mg by mouth daily.   Past Month at Unknown time  . calcium citrate-vitamin D (CITRACAL+D) 315-200 MG-UNIT tablet  Take 1 tablet by mouth 2 (two) times daily.   12/28/2018 at Unknown time  . Dulaglutide (TRULICITY) 0.75 MG/0.5ML SOPN Inject 0.75 mg into the skin every Sunday.   12/22/2018 at Unknown time  . eplerenone (INSPRA) 50 MG tablet Take 50 mg by mouth every morning.   12/28/2018 at Unknown time  . ferrous sulfate 325 (65 FE) MG tablet Take 325 mg by mouth 2 (two) times daily.   12/28/2018 at Unknown time  . hydrochlorothiazide (HYDRODIURIL) 25 MG tablet Take 25 mg by mouth daily.   12/28/2018 at Unknown time  . HYDROmorphone (DILAUDID) 2 MG tablet Take 2 mg by mouth every 4 (four) hours.   12/28/2018 at 1500  . insulin aspart (NOVOLOG FLEXPEN) 100 UNIT/ML FlexPen Inject 12 Units into the skin 3 (three) times daily with meals. Add 2 units for every level of 50 over as directed by physician per sliding scale   12/28/2018 at Unknown time  . Insulin Degludec (TRESIBA FLEXTOUCH) 200 UNIT/ML SOPN Inject 128 Units into the skin every  morning.   12/28/2018 at Unknown time  . isosorbide mononitrate (ISMO,MONOKET) 10 MG tablet Take 15 mg by mouth 2 (two) times daily.   12/28/2018 at Unknown time  . loperamide (IMODIUM A-D) 2 MG tablet Take 2 mg by mouth 4 (four) times daily as needed for diarrhea or loose stools.   12/28/2018 at 1500  . Magnesium Carbonate (MAGONATE) 54 (Mag Equiv) MG/5ML LIQD Take 5 mLs by mouth 4 (four) times daily.   12/28/2018 at Unknown time  . metFORMIN (GLUCOPHAGE) 1000 MG tablet Take 1,000 mg by mouth 2 (two) times daily with a meal.   12/28/2018 at Unknown time  . metoprolol tartrate (LOPRESSOR) 25 MG tablet Take 25 mg by mouth daily.   12/28/2018 at 1500  . Multiple Vitamin (MULTIVITAMIN WITH MINERALS) TABS tablet Take 1 tablet by mouth daily.   12/28/2018 at Unknown time  . nystatin (MYCOSTATIN/NYSTOP) powder Apply topically 2 (two) times daily as needed (for redness/irritation).   Past Week at Unknown time  . omeprazole (PRILOSEC) 20 MG capsule Take 20 mg by mouth every morning.   12/28/2018 at Unknown  time  . OVER THE COUNTER MEDICATION Take 1,350 mg by mouth 2 (two) times daily. Barley [place]   12/28/2018 at Unknown time  . oxybutynin (DITROPAN) 5 MG tablet Take 5 mg by mouth 3 (three) times daily as needed for bladder spasms.   12/28/2018 at Unknown time  . prasugrel (EFFIENT) 5 MG TABS tablet Take 5 mg by mouth daily.   HOLD  . Vitamin D, Ergocalciferol, (DRISDOL) 1.25 MG (50000 UT) CAPS capsule Take 50,000 Units by mouth 2 (two) times a week. Mondays and Thursdays   12/26/2018 at Unknown time  . zinc gluconate 50 MG tablet Take 50 mg by mouth 2 (two) times daily.   12/28/2018 at Unknown time    Assessment: 68 yo male presented with worsening cough and fever.  Chest x-ray showed possible RLL pneumonia.  CTA showed moderate large volume right sided pulmonary embolism.    Heparin level therapeutic at 0.51  Goal of Therapy:  Heparin level 0.3-0.7 units/ml Monitor platelets by anticoagulation protocol: Yes   Plan:  Continue heparin infusion at 1500 units/hr Check anti-Xa level daily while on heparin Continue to monitor H&H and platelets  Judeth Cornfield, PharmD Clinical Pharmacist 12/30/2018 11:04 AM

## 2018-12-31 ENCOUNTER — Inpatient Hospital Stay (HOSPITAL_COMMUNITY): Payer: Medicare Other

## 2018-12-31 DIAGNOSIS — R042 Hemoptysis: Secondary | ICD-10-CM

## 2018-12-31 LAB — BASIC METABOLIC PANEL
Anion gap: 11 (ref 5–15)
BUN: 23 mg/dL (ref 8–23)
CO2: 21 mmol/L — ABNORMAL LOW (ref 22–32)
Calcium: 8.6 mg/dL — ABNORMAL LOW (ref 8.9–10.3)
Chloride: 105 mmol/L (ref 98–111)
Creatinine, Ser: 0.92 mg/dL (ref 0.61–1.24)
GFR calc Af Amer: >60 mL/min (ref >60)
GFR calc non Af Amer: >60 mL/min (ref >60)
Glucose, Bld: 89 mg/dL (ref 70–99)
POTASSIUM: 3.4 mmol/L — AB (ref 3.5–5.1)
Sodium: 137 mmol/L (ref 135–145)

## 2018-12-31 LAB — PROCALCITONIN: PROCALCITONIN: 0.17 ng/mL

## 2018-12-31 LAB — CBC
HCT: 34.1 % — ABNORMAL LOW (ref 39.0–52.0)
Hemoglobin: 11.3 g/dL — ABNORMAL LOW (ref 13.0–17.0)
MCH: 30.1 pg (ref 26.0–34.0)
MCHC: 33.1 g/dL (ref 30.0–36.0)
MCV: 90.9 fL (ref 80.0–100.0)
PLATELETS: 152 10*3/uL (ref 150–400)
RBC: 3.75 MIL/uL — AB (ref 4.22–5.81)
RDW: 12.4 % (ref 11.5–15.5)
WBC: 6.5 10*3/uL (ref 4.0–10.5)
nRBC: 0 % (ref 0.0–0.2)

## 2018-12-31 LAB — HEPARIN LEVEL (UNFRACTIONATED)
Heparin Unfractionated: 0.24 IU/mL — ABNORMAL LOW (ref 0.30–0.70)
Heparin Unfractionated: 0.24 IU/mL — ABNORMAL LOW (ref 0.30–0.70)

## 2018-12-31 LAB — URINE CULTURE: Culture: >=100000 — AB

## 2018-12-31 LAB — GLUCOSE, CAPILLARY: Glucose-Capillary: 88 mg/dL (ref 70–99)

## 2018-12-31 MED ORDER — HEPARIN BOLUS VIA INFUSION
1400.0000 [IU] | Freq: Once | INTRAVENOUS | Status: AC
  Administered 2018-12-31: 1400 [IU] via INTRAVENOUS
  Filled 2018-12-31: qty 1400

## 2018-12-31 MED ORDER — HYDROMORPHONE HCL 4 MG PO TABS
4.0000 mg | ORAL_TABLET | Freq: Four times a day (QID) | ORAL | Status: DC
  Administered 2018-12-31 – 2019-01-01 (×3): 4 mg via ORAL
  Filled 2018-12-31 (×4): qty 1

## 2018-12-31 MED ORDER — CYCLOBENZAPRINE HCL 10 MG PO TABS
5.0000 mg | ORAL_TABLET | Freq: Three times a day (TID) | ORAL | Status: DC
  Administered 2018-12-31 – 2019-01-01 (×2): 5 mg via ORAL
  Filled 2018-12-31 (×2): qty 1

## 2018-12-31 MED ORDER — HYDROCODONE-HOMATROPINE 5-1.5 MG/5ML PO SYRP
5.0000 mL | ORAL_SOLUTION | Freq: Four times a day (QID) | ORAL | Status: DC
  Administered 2018-12-31 – 2019-01-01 (×3): 5 mL via ORAL
  Filled 2018-12-31 (×4): qty 5

## 2018-12-31 MED ORDER — LOPERAMIDE HCL 2 MG PO CAPS
4.0000 mg | ORAL_CAPSULE | Freq: Four times a day (QID) | ORAL | Status: DC
  Administered 2018-12-31 – 2019-01-01 (×3): 4 mg via ORAL
  Filled 2018-12-31 (×3): qty 2

## 2018-12-31 NOTE — Progress Notes (Signed)
Pt and wife at bedside and concerned about plan of care and discharge planning.  Wants to speak to MD, call placed to notify attending MD.  Michael Salinas

## 2018-12-31 NOTE — Progress Notes (Signed)
ANTICOAGULATION CONSULT NOTE   Pharmacy Consult for heparin Indication: pulmonary embolus  Allergies  Allergen Reactions  . Ace Inhibitors Swelling  . Amlodipine Swelling  . Ceftriaxone Hives and Other (See Comments)    Reaction is unknown to patient. Possible reaction resulting in hives   . Nsaids Other (See Comments)    Stomach pain  . Phenergan [Promethazine Hcl] Other (See Comments)    hallucinations  . Versed [Midazolam] Nausea And Vomiting  . Adhesive [Tape] Rash    Skin irritation  . Contrast Media [Iodinated Diagnostic Agents] Rash  . Latex Rash    Skin irritation-severe    Patient Measurements: Height: 5\' 10"  (177.8 cm) Weight: 207 lb 7.3 oz (94.1 kg) IBW/kg (Calculated) : 73 Heparin Dosing Weight: 92 kg  Vital Signs: Temp: 98.4 F (36.9 C) (01/21 1300) Temp Source: Oral (01/21 1300) BP: 139/71 (01/21 1300) Pulse Rate: 66 (01/21 1300)  Labs: Recent Labs    12/29/18 0027  12/29/18 0712 12/29/18 1409 12/30/18 0746 12/30/18 0852 12/31/18 0742 12/31/18 1640  HGB  --    < > 11.6*  --  10.6*  --  11.3*  --   HCT  --   --  35.9*  --  33.2*  --  34.1*  --   PLT  --   --  102*  --  136*  --  152  --   APTT 43*  --   --   --   --   --   --   --   LABPROT 16.7*  --   --   --   --   --   --   --   INR 1.36  --   --   --   --   --   --   --   HEPARINUNFRC  --    < > 0.36  --   --  0.51 0.24* 0.24*  CREATININE  --   --  1.02  --  0.93  --  0.92  --   TROPONINI <0.03  --  <0.03 <0.03  --   --   --   --    < > = values in this interval not displayed.    Estimated Creatinine Clearance: 92.2 mL/min (by C-G formula based on SCr of 0.92 mg/dL).   Medical History: Past Medical History:  Diagnosis Date  . Diabetes mellitus without complication (HCC)    type 2    Medications:  Medications Prior to Admission  Medication Sig Dispense Refill Last Dose  . ascorbic acid (VITAMIN C) 1000 MG tablet Take 1,000 mg by mouth 2 (two) times daily.   12/28/2018 at Unknown  time  . aspirin EC 81 MG tablet Take 81 mg by mouth daily.   Past Month at Unknown time  . calcium citrate-vitamin D (CITRACAL+D) 315-200 MG-UNIT tablet Take 1 tablet by mouth 2 (two) times daily.   12/28/2018 at Unknown time  . Dulaglutide (TRULICITY) 0.75 MG/0.5ML SOPN Inject 0.75 mg into the skin every Sunday.   12/22/2018 at Unknown time  . eplerenone (INSPRA) 50 MG tablet Take 50 mg by mouth every morning.   12/28/2018 at Unknown time  . ferrous sulfate 325 (65 FE) MG tablet Take 325 mg by mouth 2 (two) times daily.   12/28/2018 at Unknown time  . hydrochlorothiazide (HYDRODIURIL) 25 MG tablet Take 25 mg by mouth daily.   12/28/2018 at Unknown time  . HYDROmorphone (DILAUDID) 2 MG tablet Take 2 mg by mouth  every 4 (four) hours.   12/28/2018 at 1500  . insulin aspart (NOVOLOG FLEXPEN) 100 UNIT/ML FlexPen Inject 12 Units into the skin 3 (three) times daily with meals. Add 2 units for every level of 50 over as directed by physician per sliding scale   12/28/2018 at Unknown time  . Insulin Degludec (TRESIBA FLEXTOUCH) 200 UNIT/ML SOPN Inject 128 Units into the skin every morning.   12/28/2018 at Unknown time  . isosorbide mononitrate (ISMO,MONOKET) 10 MG tablet Take 15 mg by mouth 2 (two) times daily.   12/28/2018 at Unknown time  . loperamide (IMODIUM A-D) 2 MG tablet Take 2 mg by mouth 4 (four) times daily as needed for diarrhea or loose stools.   12/28/2018 at 1500  . Magnesium Carbonate (MAGONATE) 54 (Mag Equiv) MG/5ML LIQD Take 5 mLs by mouth 4 (four) times daily.   12/28/2018 at Unknown time  . metFORMIN (GLUCOPHAGE) 1000 MG tablet Take 1,000 mg by mouth 2 (two) times daily with a meal.   12/28/2018 at Unknown time  . metoprolol tartrate (LOPRESSOR) 25 MG tablet Take 25 mg by mouth daily.   12/28/2018 at 1500  . Multiple Vitamin (MULTIVITAMIN WITH MINERALS) TABS tablet Take 1 tablet by mouth daily.   12/28/2018 at Unknown time  . nystatin (MYCOSTATIN/NYSTOP) powder Apply topically 2 (two) times daily as  needed (for redness/irritation).   Past Week at Unknown time  . omeprazole (PRILOSEC) 20 MG capsule Take 20 mg by mouth every morning.   12/28/2018 at Unknown time  . OVER THE COUNTER MEDICATION Take 1,350 mg by mouth 2 (two) times daily. [place]   12/28/2018 at Unknown time  . oxybutynin (DITROPAN) 5 MG tablet Take 5 mg by mouth 3 (three) times daily as needed for bladder spasms.   12/28/2018 at Unknown time  . prasugrel (EFFIENT) 5 MG TABS tablet Take 5 mg by mouth daily.   HOLD  . Vitamin D, Ergocalciferol, (DRISDOL) 1.25 MG (50000 UT) CAPS capsule Take 50,000 Units by mouth 2 (two) times a week. Mondays and Thursdays   12/26/2018 at Unknown time  . zinc gluconate 50 MG tablet Take 50 mg by mouth 2 (two) times daily.   12/28/2018 at Unknown time    Assessment: 68 yo male presented with worsening cough and fever.  Chest x-ray showed possible RLL pneumonia.  CTA showed moderate large volume right sided pulmonary embolism.    Heparin level subtherapeutic at 0.24  Goal of Therapy:  Heparin level 0.3-0.7 units/ml Monitor platelets by anticoagulation protocol: Yes   Plan:  Rebolus 1400 units x1 Increase heparin infusion to 1800 units/hr Check anti-Xa level in 6 hours and daily while on heparin Continue to monitor H&H and platelets  Luan Pulling, PharmD, MBA, BCGP Clinical Pharmacist  12/31/2018 7:03 PM

## 2018-12-31 NOTE — Progress Notes (Signed)
ANTICOAGULATION CONSULT NOTE   Pharmacy Consult for heparin Indication: pulmonary embolus  Allergies  Allergen Reactions  . Ace Inhibitors Swelling  . Amlodipine Swelling  . Ceftriaxone Hives and Other (See Comments)    Reaction is unknown to patient. Possible reaction resulting in hives   . Nsaids Other (See Comments)    Stomach pain  . Phenergan [Promethazine Hcl] Other (See Comments)    hallucinations  . Versed [Midazolam] Nausea And Vomiting  . Adhesive [Tape] Rash    Skin irritation  . Contrast Media [Iodinated Diagnostic Agents] Rash  . Latex Rash    Skin irritation-severe    Patient Measurements: Height: 5\' 10"  (177.8 cm) Weight: 207 lb 7.3 oz (94.1 kg) IBW/kg (Calculated) : 73 Heparin Dosing Weight: 92 kg  Vital Signs: Temp: 98.1 F (36.7 C) (01/21 0515) Temp Source: Oral (01/21 0515) BP: 140/82 (01/21 1002) Pulse Rate: 63 (01/21 1002)  Labs: Recent Labs    12/28/18 1723 12/29/18 0027 12/29/18 0712 12/29/18 1409 12/30/18 0746 12/30/18 0852 12/31/18 0742  HGB 12.2*  --  11.6*  --  10.6*  --   --   HCT 37.3*  --  35.9*  --  33.2*  --   --   PLT 131*  --  102*  --  136*  --   --   APTT  --  43*  --   --   --   --   --   LABPROT  --  16.7*  --   --   --   --   --   INR  --  1.36  --   --   --   --   --   HEPARINUNFRC  --   --  0.36  --   --  0.51 0.24*  CREATININE 1.04  --  1.02  --  0.93  --   --   TROPONINI <0.03 <0.03 <0.03 <0.03  --   --   --     Estimated Creatinine Clearance: 91.2 mL/min (by C-G formula based on SCr of 0.93 mg/dL).   Medical History: Past Medical History:  Diagnosis Date  . Diabetes mellitus without complication (HCC)    type 2    Medications:  Medications Prior to Admission  Medication Sig Dispense Refill Last Dose  . ascorbic acid (VITAMIN C) 1000 MG tablet Take 1,000 mg by mouth 2 (two) times daily.   12/28/2018 at Unknown time  . aspirin EC 81 MG tablet Take 81 mg by mouth daily.   Past Month at Unknown time  .  calcium citrate-vitamin D (CITRACAL+D) 315-200 MG-UNIT tablet Take 1 tablet by mouth 2 (two) times daily.   12/28/2018 at Unknown time  . Dulaglutide (TRULICITY) 0.75 MG/0.5ML SOPN Inject 0.75 mg into the skin every Sunday.   12/22/2018 at Unknown time  . eplerenone (INSPRA) 50 MG tablet Take 50 mg by mouth every morning.   12/28/2018 at Unknown time  . ferrous sulfate 325 (65 FE) MG tablet Take 325 mg by mouth 2 (two) times daily.   12/28/2018 at Unknown time  . hydrochlorothiazide (HYDRODIURIL) 25 MG tablet Take 25 mg by mouth daily.   12/28/2018 at Unknown time  . HYDROmorphone (DILAUDID) 2 MG tablet Take 2 mg by mouth every 4 (four) hours.   12/28/2018 at 1500  . insulin aspart (NOVOLOG FLEXPEN) 100 UNIT/ML FlexPen Inject 12 Units into the skin 3 (three) times daily with meals. Add 2 units for every level of 50 over as directed  by physician per sliding scale   12/28/2018 at Unknown time  . Insulin Degludec (TRESIBA FLEXTOUCH) 200 UNIT/ML SOPN Inject 128 Units into the skin every morning.   12/28/2018 at Unknown time  . isosorbide mononitrate (ISMO,MONOKET) 10 MG tablet Take 15 mg by mouth 2 (two) times daily.   12/28/2018 at Unknown time  . loperamide (IMODIUM A-D) 2 MG tablet Take 2 mg by mouth 4 (four) times daily as needed for diarrhea or loose stools.   12/28/2018 at 1500  . Magnesium Carbonate (MAGONATE) 54 (Mag Equiv) MG/5ML LIQD Take 5 mLs by mouth 4 (four) times daily.   12/28/2018 at Unknown time  . metFORMIN (GLUCOPHAGE) 1000 MG tablet Take 1,000 mg by mouth 2 (two) times daily with a meal.   12/28/2018 at Unknown time  . metoprolol tartrate (LOPRESSOR) 25 MG tablet Take 25 mg by mouth daily.   12/28/2018 at 1500  . Multiple Vitamin (MULTIVITAMIN WITH MINERALS) TABS tablet Take 1 tablet by mouth daily.   12/28/2018 at Unknown time  . nystatin (MYCOSTATIN/NYSTOP) powder Apply topically 2 (two) times daily as needed (for redness/irritation).   Past Week at Unknown time  . omeprazole (PRILOSEC) 20 MG  capsule Take 20 mg by mouth every morning.   12/28/2018 at Unknown time  . OVER THE COUNTER MEDICATION Take 1,350 mg by mouth 2 (two) times daily. [place]   12/28/2018 at Unknown time  . oxybutynin (DITROPAN) 5 MG tablet Take 5 mg by mouth 3 (three) times daily as needed for bladder spasms.   12/28/2018 at Unknown time  . prasugrel (EFFIENT) 5 MG TABS tablet Take 5 mg by mouth daily.   HOLD  . Vitamin D, Ergocalciferol, (DRISDOL) 1.25 MG (50000 UT) CAPS capsule Take 50,000 Units by mouth 2 (two) times a week. Mondays and Thursdays   12/26/2018 at Unknown time  . zinc gluconate 50 MG tablet Take 50 mg by mouth 2 (two) times daily.   12/28/2018 at Unknown time    Assessment: 68 yo male presented with worsening cough and fever.  Chest x-ray showed possible RLL pneumonia.  CTA showed moderate large volume right sided pulmonary embolism.    Heparin level subtherapeutic at 0.24  Goal of Therapy:  Heparin level 0.3-0.7 units/ml Monitor platelets by anticoagulation protocol: Yes   Plan:  Rebolus 1400 units x1 Increase heparin infusion to 1650 units/hr Check anti-Xa level in 6 hours and daily while on heparin Continue to monitor H&H and platelets  Judeth Cornfield, PharmD Clinical Pharmacist 12/31/2018 10:21 AM

## 2018-12-31 NOTE — Progress Notes (Signed)
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 The patient had been complaining of pleuritic type chest pain during this period of time associated with his coughing. He states that he is coughing up approximately half teaspoon of blood. As result, he presented for further evaluation. He has had fevers up to 101.9 F. He denied any headache, neck pain, nausea, vomiting, diarrhea,hematochezia, melena. Remarkably, the patient states that he was not particularly short of breath. Upon presentation, initial chest x-ray suggested right lower lobe opacity. CT angiogram of the chest showed large volume right lower lobe pulmonary emboli with reactive right hilar lymphadenopathy. There is patchy right lower lobe airspace disease. CT of the abdomen and pelvis did not show any acute findings. He did have a cirrhotic appearing liver with some splenomegaly. The patient was started on intravenous heparin, vancomycin, and aztreonam. Regarding his recent history, he was [facility] from 10/24/2017 through 10/30/2018 during which she had a rectourethral fistula repair with gracilis flap. His hospitalization was complicated by a complicated UTI for which he was discharged home with ciprofloxacin. He followed up with [facility] on 12/17/2018. At that time, a suprapubic catheter was placed. His urethral catheter was placed during the hospitalization in November 2019. It has not been changed. In the emergency department, the patient was afebrile hemodynamically stable saturating 93-94% on room air.  WBC was 13.2. BMP showed a sodium 128, serum creatinine 1.04 LFTs were unremarkable here. Lactic acid was 3.2.  Assessment/Plan: Sepsis -Likely urinary source -present on admission -Continue vancomycin and aztreonam pending culture data -The patient has a rash/hives to ceftriaxone -Continue IV fluids>>> saline lock -Check procalcitonin-->0.27>>0.17 -INR 1.36, PTT 43 -UA 21-50 WBC, 11-20 RBC. -sepsis physiology resolved -d/c vanco  Submassive pulmonary emboli -CTA chest as discussed above -Continue IV heparin -Echocardiogram--EF 65-70%, no WMA, grade 1 DD, acute function, normal IVC -Monitor H&H-->stablizing and now trending back up -Patient is currently stable on room air  Hemoptysis -Due to pulmonary infarct -Case was discussed with pulmonary, Dr. Craige CottaSood -As the patient is hemodynamically stable saturating well on room air and hemoglobin stable, anticipate stabilization and improvement of hemoptysis slowly -d/c home when hemoptysis starting to show improvement -still coughing up 1/2 teaspoon amount 4-5 times daily -start hycodan scheduled  UTI/catheter associated UTI -E. coli on culture -Continue aztreonam  Thrombocytopenia -Multifactorial including consumption from pulmonary embolus as well as sequestration from the patient splenomegaly/NASH Cirrhosis -Monitor clinically for signs of worsening bleed  Hemoptysis -Approximately half teaspoons worth each time -Monitor clinically -Secondary to pulmonary infarct -Repeat chest x-ray  Ileostomy/loose stool -restart imodium home dose  Coronary artery disease status post PCI 2011 -He has been off of aspirin and Effient for 2 months--he had consulted his cardiologist prior to his surgeries and procedures -No chest pain presently--pleuritic component has improved -Continue isosorbide mononitrate -Continue metoprolol tartrate  Essential hypertension -Holding HCTZ secondary to hyponatremia and soft blood  pressure -Holding Inspra -Decrease metoprolol tartrate due to soft blood pressure  Diabetes mellitus type 2 uncontrolled with hyperglycemia -Hemoglobin A1c--8.1 -Continue Lantus -NovoLog sliding scale -Holding metformin and Trulicity  Hyponatremia -Secondary to  medications and liver cirrhosis -am BMP  Chronic pelvic/postop pain -start home dose dilaudid scheduled -add flexeril for back spasms    Disposition Plan: Home in 2-3 days when hemoptysis improves Family Communication:spouse update at bedside 12/31/18  Consultants:none  Code Status: FULL   DVT Prophylaxis:IVHeparin    Procedures: As Listed in Progress Note Above  Antibiotics: vanco1/18>>> Aztreonam 1/18>>>  Total time spent 55 minutes.  Greater than 50% spent face to face counseling and coordinating care.    Subjective: He states that his pleuritic chest pain is improving.  He denies any nausea, vomiting, diarrhea.  He has loose ileostomy output which is about the same.  He denies any hematochezia or melena.  He states that his hemoptysis is about the same.  Objective: Vitals:   12/30/18 2148 12/31/18 0515 12/31/18 1002 12/31/18 1300  BP: 119/77 130/79 140/82 139/71  Pulse: 65 63 63 66  Resp:  20 18 19   Temp: 98.7 F (37.1 C) 98.1 F (36.7 C) 98.1 F (36.7 C) 98.4 F (36.9 C)  TempSrc: Oral Oral  Oral  SpO2: 95% 96% 95% 96%  Weight:      Height:        Intake/Output Summary (Last 24 hours) at 12/31/2018 1808 Last data filed at 12/31/2018 1700 Gross per 24 hour  Intake 720 ml  Output 450 ml  Net 270 ml   Weight change:  Exam:   General:  Pt is alert, follows commands appropriately, not in acute distress  HEENT: No icterus, No thrush, No neck mass, [place]/AT  Cardiovascular: RRR, S1/S2, no rubs, no gallops  Respiratory: Bibasilar crackles.  Left clear to auscultation.  Abdomen: Soft/+BS, non tender, non distended, no guarding  Extremities: No edema, No  lymphangitis, No petechiae, No rashes, no synovitis   Data Reviewed: I have personally reviewed following labs and imaging studies Basic Metabolic Panel: Recent Labs  Lab 12/28/18 1723 12/29/18 0712 12/30/18 0746 12/31/18 0742  NA 128* 132* 134* 137  K 4.2 4.7 3.5 3.4*  CL 93* 98 101 105  CO2 22 24 23  21*  GLUCOSE 285* 322* 92 89  BUN 20 24* 31* 23  CREATININE 1.04 1.02 0.93 0.92  CALCIUM 9.5 8.9 8.8* 8.6*  MG  --   --  1.6*  --    Liver Function Tests: Recent Labs  Lab 12/28/18 1723 12/29/18 0712  AST 26 19  ALT 22 20  ALKPHOS 60 54  BILITOT 1.3* 1.0  PROT 7.1 6.7  ALBUMIN 3.0* 2.7*   No results for input(s): LIPASE, AMYLASE in the last 168 hours. No results for input(s): AMMONIA in the last 168 hours. Coagulation Profile: Recent Labs  Lab 12/29/18 0027  INR 1.36   CBC: Recent Labs  Lab 12/28/18 1723 12/29/18 0712 12/30/18 0746 12/31/18 0742  WBC 15.2* 9.2 10.5 6.5  NEUTROABS 12.8*  --   --   --   HGB 12.2* 11.6* 10.6* 11.3*  HCT 37.3* 35.9* 33.2* 34.1*  MCV 89.2 91.8 90.0 90.9  PLT 131* 102* 136* 152   Cardiac Enzymes: Recent Labs  Lab 12/28/18 1723 12/29/18 0027 12/29/18 0712 12/29/18 1409  TROPONINI <0.03 <0.03 <0.03 <0.03   BNP: Invalid input(s): POCBNP CBG: Recent Labs  Lab 12/29/18 1130 12/29/18 1610 12/29/18 2148 12/30/18 0731 12/31/18 0734  GLUCAP 354* 262* 199* 93 88   HbA1C: Recent Labs    12/29/18 0712  HGBA1C 8.1*   Urine analysis:    Component Value Date/Time   COLORURINE BROWN (A) 12/28/2018 1750  APPEARANCEUR HAZY (A) 12/28/2018 1750   LABSPEC >1.030 (H) 12/28/2018 1750   PHURINE 6.0 12/28/2018 1750   GLUCOSEU NEGATIVE 12/28/2018 1750   HGBUR LARGE (A) 12/28/2018 1750   BILIRUBINUR NEGATIVE 12/28/2018 1750   KETONESUR NEGATIVE 12/28/2018 1750   PROTEINUR 100 (A) 12/28/2018 1750   NITRITE POSITIVE (A) 12/28/2018 1750   LEUKOCYTESUR MODERATE (A) 12/28/2018 1750   Sepsis  Labs: @LABRCNTIP (procalcitonin:4,lacticidven:4) ) Recent Results (from the past 240 hour(s))  Blood Culture (routine x 2)     Status: None (Preliminary result)   Collection Time: 12/28/18  5:36 PM  Result Value Ref Range Status   Specimen Description BLOOD LEFT HAND  Final   Special Requests   Final    BOTTLES DRAWN AEROBIC AND ANAEROBIC Blood Culture adequate volume   Culture   Final    NO GROWTH 3 DAYS Performed at [facility], [address] [place], [place] [address]    Report Status PENDING  Incomplete  Urine culture     Status: Abnormal   Collection Time: 12/28/18  5:50 PM  Result Value Ref Range Status   Specimen Description   Final    URINE, SUPRAPUBIC Performed at [facility], [address] [place], [place] [address]    Special Requests   Final    NONE Performed at [facility], [address] [place], [place] [address]    Culture >=100,000 COLONIES/mL ESCHERICHIA COLI (A)  Final   Report Status 12/31/2018 FINAL  Final   Organism ID, Bacteria ESCHERICHIA COLI (A)  Final      Susceptibility   Escherichia coli - MIC*    AMPICILLIN 16 INTERMEDIATE Intermediate     CEFAZOLIN <=4 SENSITIVE Sensitive     CEFTRIAXONE <=1 SENSITIVE Sensitive     CIPROFLOXACIN >=4 RESISTANT Resistant     GENTAMICIN <=1 SENSITIVE Sensitive     IMIPENEM <=0.25 SENSITIVE Sensitive     NITROFURANTOIN <=16 SENSITIVE Sensitive     TRIMETH/SULFA <=20 SENSITIVE Sensitive     AMPICILLIN/SULBACTAM 4 SENSITIVE Sensitive     PIP/TAZO <=4 SENSITIVE Sensitive     Extended ESBL NEGATIVE Sensitive     * >=100,000 COLONIES/mL ESCHERICHIA COLI  Blood Culture (routine x 2)     Status: None (Preliminary result)   Collection Time: 12/28/18  6:47 PM  Result Value Ref Range Status   Specimen Description LEFT ANTECUBITAL  Final   Special Requests   Final    BOTTLES DRAWN AEROBIC AND ANAEROBIC Blood Culture adequate volume   Culture   Final    NO GROWTH 3 DAYS Performed at [facility], [address] [place], [place] [address]    Report Status PENDING  Incomplete  MRSA PCR Screening     Status: None   Collection Time: 12/30/18  1:14 PM  Result Value Ref Range Status   MRSA by PCR NEGATIVE NEGATIVE Final    Comment:        The GeneXpert MRSA Assay (FDA approved for NASAL specimens only), is one component of a comprehensive MRSA colonization surveillance program. It is not intended to diagnose MRSA infection nor to guide or monitor treatment for MRSA infections. Performed at [facility], [address] [place], [place] [address]      Scheduled Meds: . benzonatate  200 mg Oral TID  . calcium-vitamin D  1 tablet Oral BID  . ferrous sulfate  325 mg Oral BID  . HYDROcodone-homatropine  5 mL Oral Q6H WA  . HYDROmorphone  2 mg Oral Once  .  HYDROmorphone  4 mg Oral Q6H  . insulin aspart  0-9 Units Subcutaneous TID WC  . insulin glargine  128 Units Subcutaneous q morning - 10a  . isosorbide mononitrate  15 mg Oral BID  . loperamide  4 mg Oral Q6H  . magnesium oxide  400 mg Oral Daily  . metoprolol tartrate  12.5 mg Oral Daily  . multivitamin with minerals  1 tablet Oral Daily  . pantoprazole  40 mg Oral Daily   Continuous Infusions: . aztreonam 2 g (12/31/18 1457)  . heparin 1,650 Units/hr (12/31/18 1059)  . methocarbamol (ROBAXIN) IV    . vancomycin 1,250 mg (12/31/18 1115)  . vancomycin      Procedures/Studies: Dg Chest 2 View  Result Date: 12/28/2018 CLINICAL DATA:  Cough and fever. EXAM: CHEST - 2 VIEW COMPARISON:  None. FINDINGS: The heart, hila, and mediastinum are normal. There is opacity in the right base. No other acute abnormalities. IMPRESSION: Right basilar opacity concerning for pneumonia given history. Recommend short-term follow-up chest x-ray to ensure resolution of the pulmonary opacity. No other acute abnormalities identified. Electronically Signed   By: Gerome Sam III M.D   On: 12/28/2018 18:02   Ct Angio Chest Pe W And/or Wo Contrast  Result  Date: 12/28/2018 CLINICAL DATA:  Surgery on November 14th to correct fistula. Suprapubic catheter. Colostomy bag. Right-sided flank pain with productive cough, hemoptysis. Ex-smoker. EXAM: CT ANGIOGRAPHY CHEST CT ABDOMEN AND PELVIS WITH CONTRAST TECHNIQUE: Multidetector CT imaging of the chest was performed using the standard protocol during bolus administration of intravenous contrast. Multiplanar CT image reconstructions and MIPs were obtained to evaluate the vascular anatomy. Multidetector CT imaging of the abdomen and pelvis was performed using the standard protocol during bolus administration of intravenous contrast. CONTRAST:  ISOVUE-300 IOPAMIDOL (ISOVUE-300) INJECTION 61% COMPARISON:  Chest radiograph of earlier today FINDINGS: CTA CHEST FINDINGS Cardiovascular: The quality of this exam for evaluation of pulmonary embolism is sufficient. Large volume right lower lobe pulmonary emboli, including within the lobar and majority of segmental branches, including on image 47/5. Suspect mild right heart strain, as evidenced by an RA to RV ratio of 41/42 mm. Aortic and branch vessel atherosclerosis. Normal heart size, without pericardial effusion. Multivessel coronary artery atherosclerosis. Mediastinum/Nodes: A node within the azygoesophageal recess is mildly enlarged at 11 mm on image 44/5. Right hilar node of 12 mm on image 38/5. Lungs/Pleura: Trace right pleural fluid or thickening. Mild degradation secondary to right arm position, not raised above the head. Patient body habitus degradation as well. Mild left base subsegmental atelectasis. Patchy right lower lobe airspace disease. Musculoskeletal: Bilateral moderate gynecomastia. No acute osseous abnormality. Review of the MIP images confirms the above findings. CT ABDOMEN and PELVIS FINDINGS Hepatobiliary: Mild cirrhosis, as evidenced by an irregular hepatic capsule and relative enlargement of the caudate lobe. No focal liver lesion. Normal gallbladder,  without biliary ductal dilatation. Pancreas: Normal, without mass or ductal dilatation. Spleen: Splenomegaly at 16.1 cm craniocaudal. Adrenals/Urinary Tract: Mild left adrenal thickening. Normal right adrenal gland. Normal kidneys, without hydronephrosis. Suprapubic catheter decompresses the urinary bladder. There is also Foley catheter which is incompletely imaged. Stomach/Bowel: Proximal gastric underdistention. Right-sided probable loop type colostomy. Normal small bowel caliber. Normal appendix. Normal small bowel caliber. Vascular/Lymphatic: Aortic and branch vessel atherosclerosis. Patent portal and splenic veins. No specific evidence of portal venous hypertension. No abdominopelvic adenopathy. Reproductive: Prostatectomy. Other: No significant free fluid. Small fat containing left inguinal hernia. Ventral abdominal wall laxity and a small fat containing  right paramidline hernia. Musculoskeletal: No acute osseous abnormality. Review of the MIP images confirms the above findings. IMPRESSION: 1. Moderate to large volume right-sided pulmonary embolism. Positive for acute PE with CT evidence of right heart strain (RV/LV Ratio = 0.97) consistent with at least submassive (intermediate risk) PE. The presence of right heart strain has been associated with an increased risk of morbidity and mortality. Please activate Code PE by paging [phone]. Left base atelectasis. Right base airspace disease could represent atelectasis and/or developing infarct. 2. No acute process in the abdomen or pelvis. 3. Cirrhosis and splenomegaly. 4. Coronary artery atherosclerosis. Aortic Atherosclerosis (ICD10-I70.0). 5. Bilateral gynecomastia. 6. Mild thoracic adenopathy, most likely reactive. These results will be called to the ordering clinician or representative by the Radiologist Assistant, and communication documented in the PACS or zVision Dashboard. Electronically Signed   By: Jeronimo Greaves M.D.   On: 12/28/2018 23:36   Ct  Abdomen Pelvis W Contrast  Result Date: 12/28/2018 CLINICAL DATA:  Surgery on November 14th to correct fistula. Suprapubic catheter. Colostomy bag. Right-sided flank pain with productive cough, hemoptysis. Ex-smoker. EXAM: CT ANGIOGRAPHY CHEST CT ABDOMEN AND PELVIS WITH CONTRAST TECHNIQUE: Multidetector CT imaging of the chest was performed using the standard protocol during bolus administration of intravenous contrast. Multiplanar CT image reconstructions and MIPs were obtained to evaluate the vascular anatomy. Multidetector CT imaging of the abdomen and pelvis was performed using the standard protocol during bolus administration of intravenous contrast. CONTRAST:  ISOVUE-300 IOPAMIDOL (ISOVUE-300) INJECTION 61% COMPARISON:  Chest radiograph of earlier today FINDINGS: CTA CHEST FINDINGS Cardiovascular: The quality of this exam for evaluation of pulmonary embolism is sufficient. Large volume right lower lobe pulmonary emboli, including within the lobar and majority of segmental branches, including on image 47/5. Suspect mild right heart strain, as evidenced by an RA to RV ratio of 41/42 mm. Aortic and branch vessel atherosclerosis. Normal heart size, without pericardial effusion. Multivessel coronary artery atherosclerosis. Mediastinum/Nodes: A node within the azygoesophageal recess is mildly enlarged at 11 mm on image 44/5. Right hilar node of 12 mm on image 38/5. Lungs/Pleura: Trace right pleural fluid or thickening. Mild degradation secondary to right arm position, not raised above the head. Patient body habitus degradation as well. Mild left base subsegmental atelectasis. Patchy right lower lobe airspace disease. Musculoskeletal: Bilateral moderate gynecomastia. No acute osseous abnormality. Review of the MIP images confirms the above findings. CT ABDOMEN and PELVIS FINDINGS Hepatobiliary: Mild cirrhosis, as evidenced by an irregular hepatic capsule and relative enlargement of the caudate lobe. No focal  liver lesion. Normal gallbladder, without biliary ductal dilatation. Pancreas: Normal, without mass or ductal dilatation. Spleen: Splenomegaly at 16.1 cm craniocaudal. Adrenals/Urinary Tract: Mild left adrenal thickening. Normal right adrenal gland. Normal kidneys, without hydronephrosis. Suprapubic catheter decompresses the urinary bladder. There is also Foley catheter which is incompletely imaged. Stomach/Bowel: Proximal gastric underdistention. Right-sided probable loop type colostomy. Normal small bowel caliber. Normal appendix. Normal small bowel caliber. Vascular/Lymphatic: Aortic and branch vessel atherosclerosis. Patent portal and splenic veins. No specific evidence of portal venous hypertension. No abdominopelvic adenopathy. Reproductive: Prostatectomy. Other: No significant free fluid. Small fat containing left inguinal hernia. Ventral abdominal wall laxity and a small fat containing right paramidline hernia. Musculoskeletal: No acute osseous abnormality. Review of the MIP images confirms the above findings. IMPRESSION: 1. Moderate to large volume right-sided pulmonary embolism. Positive for acute PE with CT evidence of right heart strain (RV/LV Ratio = 0.97) consistent with at least submassive (intermediate risk) PE. The presence of  right heart strain has been associated with an increased risk of morbidity and mortality. Please activate Code PE by paging [phone]. Left base atelectasis. Right base airspace disease could represent atelectasis and/or developing infarct. 2. No acute process in the abdomen or pelvis. 3. Cirrhosis and splenomegaly. 4. Coronary artery atherosclerosis. Aortic Atherosclerosis (ICD10-I70.0). 5. Bilateral gynecomastia. 6. Mild thoracic adenopathy, most likely reactive. These results will be called to the ordering clinician or representative by the Radiologist Assistant, and communication documented in the PACS or zVision Dashboard. Electronically Signed   By: Jeronimo Greaves M.D.    On: 12/28/2018 23:36    Catarina Hartshorn, DO  [facility] Pager [phone]  If 7PM-7AM, please contact night-coverage [email or web address] Password [email or web address] 12/31/2018, 6:08 PM   LOS: 3 days

## 2019-01-01 ENCOUNTER — Encounter (HOSPITAL_COMMUNITY): Payer: Self-pay

## 2019-01-01 DIAGNOSIS — A4151 Sepsis due to Escherichia coli [E. coli]: Secondary | ICD-10-CM

## 2019-01-01 DIAGNOSIS — E876 Hypokalemia: Secondary | ICD-10-CM

## 2019-01-01 LAB — CBC
HEMATOCRIT: 33.2 % — AB (ref 39.0–52.0)
Hemoglobin: 10.5 g/dL — ABNORMAL LOW (ref 13.0–17.0)
MCH: 29.1 pg (ref 26.0–34.0)
MCHC: 31.6 g/dL (ref 30.0–36.0)
MCV: 92 fL (ref 80.0–100.0)
Platelets: 125 10*3/uL — ABNORMAL LOW (ref 150–400)
RBC: 3.61 MIL/uL — ABNORMAL LOW (ref 4.22–5.81)
RDW: 12.3 % (ref 11.5–15.5)
WBC: 5.6 10*3/uL (ref 4.0–10.5)
nRBC: 0 % (ref 0.0–0.2)

## 2019-01-01 LAB — BASIC METABOLIC PANEL
Anion gap: 6 (ref 5–15)
BUN: 16 mg/dL (ref 8–23)
CO2: 23 mmol/L (ref 22–32)
Calcium: 8.2 mg/dL — ABNORMAL LOW (ref 8.9–10.3)
Chloride: 109 mmol/L (ref 98–111)
Creatinine, Ser: 0.8 mg/dL (ref 0.61–1.24)
GFR calc Af Amer: >60 mL/min (ref >60)
GFR calc non Af Amer: >60 mL/min (ref >60)
Glucose, Bld: 81 mg/dL (ref 70–99)
POTASSIUM: 3.1 mmol/L — AB (ref 3.5–5.1)
Sodium: 138 mmol/L (ref 135–145)

## 2019-01-01 LAB — GLUCOSE, CAPILLARY
Glucose-Capillary: 118 mg/dL — ABNORMAL HIGH (ref 70–99)
Glucose-Capillary: 335 mg/dL — ABNORMAL HIGH (ref 70–99)

## 2019-01-01 LAB — HEPARIN LEVEL (UNFRACTIONATED)
Heparin Unfractionated: 0.87 IU/mL — ABNORMAL HIGH (ref 0.30–0.70)
Heparin Unfractionated: 0.73 IU/mL — ABNORMAL HIGH (ref 0.30–0.70)

## 2019-01-01 LAB — MAGNESIUM: MAGNESIUM: 1.4 mg/dL — AB (ref 1.7–2.4)

## 2019-01-01 LAB — PROCALCITONIN: Procalcitonin: 0.11 ng/mL

## 2019-01-01 MED ORDER — APIXABAN 5 MG PO TABS
10.0000 mg | ORAL_TABLET | Freq: Two times a day (BID) | ORAL | Status: DC
  Administered 2019-01-01: 10 mg via ORAL
  Filled 2019-01-01: qty 2

## 2019-01-01 MED ORDER — POTASSIUM CHLORIDE CRYS ER 20 MEQ PO TBCR: 20.0000 meq | EXTENDED_RELEASE_TABLET | Freq: Every day | ORAL | 0 refills | Status: AC

## 2019-01-01 MED ORDER — SULFAMETHOXAZOLE-TRIMETHOPRIM 800-160 MG PO TABS
1.0000 | ORAL_TABLET | Freq: Two times a day (BID) | ORAL | Status: DC
  Administered 2019-01-01: 1 via ORAL
  Filled 2019-01-01: qty 1

## 2019-01-01 MED ORDER — HYDROCODONE-HOMATROPINE 5-1.5 MG/5ML PO SYRP: 5.0000 mL | ORAL_SOLUTION | ORAL | 0 refills | Status: AC | PRN

## 2019-01-01 MED ORDER — APIXABAN 5 MG PO TABS: 10.0000 mg | ORAL_TABLET | Freq: Two times a day (BID) | ORAL | 0 refills | Status: AC

## 2019-01-01 MED ORDER — ISOSORBIDE MONONITRATE 10 MG PO TABS: 10.0000 mg | ORAL_TABLET | Freq: Two times a day (BID) | ORAL | Status: AC

## 2019-01-01 MED ORDER — ACETAMINOPHEN 325 MG PO TABS: 650.0000 mg | ORAL_TABLET | Freq: Four times a day (QID) | ORAL | 0 refills | Status: AC | PRN

## 2019-01-01 MED ORDER — APIXABAN 5 MG PO TABS: 5.0000 mg | ORAL_TABLET | Freq: Two times a day (BID) | ORAL | 1 refills | Status: AC

## 2019-01-01 MED ORDER — SULFAMETHOXAZOLE-TRIMETHOPRIM 800-160 MG PO TABS: 1.0000 | ORAL_TABLET | Freq: Two times a day (BID) | ORAL | 0 refills | Status: AC

## 2019-01-01 MED ORDER — EPLERENONE 50 MG PO TABS: 25.0000 mg | ORAL_TABLET | Freq: Every morning | ORAL | Status: AC

## 2019-01-01 MED ORDER — APIXABAN 5 MG PO TABS: 5.0000 mg | ORAL_TABLET | Freq: Two times a day (BID) | ORAL | Status: DC

## 2019-01-01 MED ORDER — POTASSIUM CHLORIDE CRYS ER 20 MEQ PO TBCR
40.0000 meq | EXTENDED_RELEASE_TABLET | ORAL | Status: AC
  Administered 2019-01-01 (×2): 40 meq via ORAL
  Filled 2019-01-01 (×2): qty 2

## 2019-01-01 NOTE — Progress Notes (Signed)
BS 176 @ 2200, did not cross over in Epic.

## 2019-01-01 NOTE — Progress Notes (Signed)
Pharmacy Antibiotic Note  Michael Salinas is a 68 y.o. male admitted on 12/28/2018 with pneumonia.  Pharmacy has been consulted for Aztreonam and Vancomycin dosing.  Plan:  Aztreonam 2000 mg IV every 8 hours. Monitor labs, c/s, and vanco trough as indicated.  Height: 5\' 10"  (177.8 cm) Weight: 207 lb 7.3 oz (94.1 kg) IBW/kg (Calculated) : 73  Temp (24hrs), Avg:98.1 F (36.7 C), Min:97.7 F (36.5 C), Max:98.4 F (36.9 C)  Recent Labs  Lab 12/28/18 1723 12/28/18 1737 12/28/18 1847 12/29/18 0712 12/30/18 0746 12/31/18 0742 01/01/19 0449  WBC 15.2*  --   --  9.2 10.5 6.5 5.6  CREATININE 1.04  --   --  1.02 0.93 0.92 0.80  LATICACIDVEN  --  3.2* 2.9*  --   --   --   --     Estimated Creatinine Clearance: 106 mL/min (by C-G formula based on SCr of 0.8 mg/dL).    Allergies  Allergen Reactions  . Ace Inhibitors Swelling  . Amlodipine Swelling  . Ceftriaxone Hives and Other (See Comments)    Reaction is unknown to patient. Possible reaction resulting in hives   . Nsaids Other (See Comments)    Stomach pain  . Phenergan [Promethazine Hcl] Other (See Comments)    hallucinations  . Versed [Midazolam] Nausea And Vomiting  . Adhesive [Tape] Rash    Skin irritation  . Contrast Media [Iodinated Diagnostic Agents] Rash  . Latex Rash    Skin irritation-severe    Antimicrobials this admission: Vanco 1/18 >> 1/21 Aztreonam 1/18 >>   Dose adjustments this admission: N/A  Microbiology results: 1/18 BCx: ngtd 1/18 UCx:  E. Coli (int ampicillin, resistant cipro)     Thank you for allowing pharmacy to be a part of this patient's care.  Tad Moore 01/01/2019 7:32 AM

## 2019-01-01 NOTE — Discharge Summary (Signed)
Physician Discharge Summary  ALDRIDGE KRZYZANOWSKI ZOX:[id number] DOB: June 20, 1951 DOA: 12/28/2018  PCP: System, Pcp Not In  Admit date: 12/28/2018 Discharge date: 01/01/2019  Time spent: 35 minutes  Recommendations for Outpatient Follow-up:  Repeat basic metabolic panel to follow electrolytes and renal function Reassess blood pressure and further adjust antihypertensive regimen (HCTZ has been kept on hold on to follow-up visits and his Inspra dose has been cut in half). Reassess CBGs and further adjust hypoglycemic regimen as needed Repeat CBC to follow hemoglobin trend and platelets count.   Discharge Diagnoses:  Active Problems:   HCAP (healthcare-associated pneumonia)   Pulmonary embolus and infarction (HCC)   Thrombocytopenia (HCC)   Essential hypertension   Coronary artery disease   Sepsis due to undetermined organism (HCC)   Sepsis without acute organ dysfunction (HCC)   Hemoptysis   Hypokalemia   Hypomagnesemia   Discharge Condition: Stable and improved.  Discharge home with instructions to follow-up with PCP in 10 days.  Diet recommendation: Heart healthy, modified carbohydrates.  Filed Weights   12/28/18 1646 12/28/18 2112  Weight: 99.8 kg 94.1 kg    History of present illness:  As per H&P written by Dr. Sharl Ma on 12/28/2018 68 y.o. 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 Patient says that he has done well since surgery but over past 2 days he developed fever as high as one 1.9 with worsening cough initially white to yellow in color and became bloody this morning. He  denies nausea vomiting.  Denies abdominal pain. Denies shortness of breath. In the ED chest x-ray showed possible right lower lobe pneumonia and patient started on vancomycin and cefepime. CTA chest was ordered to rule out PE but patient has allergy to IV contrast dye and is currently being pretreated with Benadryl and Solu-Medrol before the CT scan.  Hospital Course:  Sepsis secondary to E. coli UTI -Sepsis features resolved by time of discharge -Patient has been discharged on Bactrim twice a day with intention to complete 6 more days. -Advised to maintain himself well-hydrated -Follow-up with PCP in 10 days. -Antibiotic choice decided based on sensitivity.  Microorganism was resistant to ciprofloxacin and intermittently to ampicillin.  Submassive pulmonary embolism -CT Angie of the chest demonstrated findings suggesting infarct of his lungs -Patient was treated for 5 days with IV heparin -Echocardiogram demonstrating no right heart strain and normal IVC -Stable hemoglobin and in fact trending back up at time of discharge -Patient with normal blood pressure, normal oxygen saturation on room air and in no major distress despite still experiencing intermittent episodes of mild hemoptysis and coughing spells. -Repeat CBC at follow-up visit -Discharge on Eliquis with loading dose of 10 mg for 7 days twice a day and subsequently 5 mg twice a day for at the very least 44-month (his pulmonary embolism was precipitated after orthopedic surgery).  Hemoptysis -Secondary to infarct/pulmonary embolism -Case was discussed by my colleague Dr. Merry Lofty with Dr. Craige Cotta (pulmonary service), who recommended that given hemodynamic stability, stable oxygen saturation on room air and a stable hemoglobin level there will be Jos continue supportive care and anticoagulation before complete resolution of  his hemoptysis.  No further treatment, evaluation or intervention recommended at this time. -Patient discharged on  Hycodan as needed to continue assisting with his coughing spells.  UTI/catheter associated infection -Culture demonstrated more than 100,000 colonies of E. coli -Patient was treated with aztreonam and subsequently transitioned to Bactrim to complete treatment -See above for further details based on the presentation of sepsis without associated organ dysfunction.  Thrombocytopenia -Multifactorial including consumption from pulmonary embolism as well as sequestration in the setting of patient history of a splenomegaly/NASH cirrhosis. -There is no really signs of acute spontaneous bleeding -Platelets count has remained stable -Repeat CBC count at follow-up visit.  History of ileostomy/loose stools -Continue home Imodium -Patient advised to maintain adequate hydration and nutrition.  History of coronary artery disease status post PCI in 2011 patient has been off of aspirin and AVN for the last 1743-month after visiting his cardiologist prior to his surgeries and procedures. -No chest pain presently -Good oxygen saturation on room air -Will continue the use of isosorbide and metoprolol -Patient advised to follow low-sodium/heart healthy diet -Continue to hold aspirin and Effient especially in the new setting of anticoagulation initiation.  Essential hypertension -Blood pressure stable and well-controlled -Continue holding HCTZ and cut dose of Inspra at discharge. -Heart healthy diet has been advised -Will recommend reassess blood pressure at follow-up visit with further adjustment to antihypertensive regimen as needed.  Diabetes mellitus; uncontrolled with hyperglycemia -A1c 8.1 -Resume home hypoglycemic regimen with outpatient follow-up for further adjustment as needed -Patient is using metformin, Trulicity, NovoLog and long-acting insulin. -Modified carbohydrate diet has been encouraged.  Chronic pelvic/postoperative pain -Resume home analgesic regimen -Patient instructed to use as  needed Tylenol for breakthrough.  Hypokalemia/hypomagnesemia -Electrolytes has been repleted and the patient discharged home on daily potassium supplementation. -Repeat basic metabolic panel to reassess electrolytes trend as an outpatient.   Procedures:  See below for x-ray reports.  2-D echo: Mild left ventricular hypertrophy, ejection fraction 65 to 70%; no regional wall motion abnormalities; grade 1 diastolic dysfunction.  Consultations:  Pulmonary service (Dr. Craige CottaSood) was curbside by Dr. Arbutus Leasat; recommended no need for transfer and after further stabilize safe to discharge on oral anticoagulation and outpatient follow up.  Discharge Exam: Vitals:   01/01/19 0617 01/01/19 0904  BP: (!) 116/59 121/82  Pulse: 62 75  Resp:    Temp: 97.7 F (36.5 C) 97.9 F (36.6 C)  SpO2: 94% 97%    General: Afebrile, good oxygen saturation on room air, speaking in full sentences, still with mild intermittent episodes of hemoptysis after coughing spells.  In no acute distress and denying active ongoing chest pain currently. Cardiovascular: S1 and S2, no rubs, no gallops, regular rate and rhythm; no JVD. Respiratory: Good air movement bilaterally, no crackles, no wheezing, no using accessory muscles. Abdomen: Soft, nontender, nondistended, positive bowel sounds Extremities: No edema, no cyanosis or clubbing.  Discharge Instructions   Discharge Instructions    Diet - low sodium heart healthy   Complete by:  As directed    Discharge instructions   Complete by:  As directed    Take medications as prescribed Avoid the use of NSAIDs Arrange follow-up with PCP in 10 days Maintain adequate hydration Follow low-sodium/heart healthy diet and check weight on daily basis.   Increase activity slowly   Complete by:  As directed      Allergies as of 01/01/2019      Reactions   Ace Inhibitors Swelling   Amlodipine Swelling   Ceftriaxone Hives,  Other (See Comments)   Reaction is unknown to patient.  Possible reaction resulting in hives    Nsaids Other (See Comments)   Stomach pain   Phenergan [promethazine Hcl] Other (See Comments)   hallucinations   Versed [midazolam] Nausea And Vomiting   Adhesive [tape] Rash   Skin irritation   Contrast Media [iodinated Diagnostic Agents] Rash   Latex Rash   Skin irritation-severe      Medication List    STOP taking these medications   aspirin EC 81 MG tablet   hydrochlorothiazide 25 MG tablet Commonly known as:  HYDRODIURIL   prasugrel 5 MG Tabs tablet Commonly known as:  EFFIENT     TAKE these medications   acetaminophen 325 MG tablet Commonly known as:  TYLENOL Take 2 tablets (650 mg total) by mouth every 6 (six) hours as needed for mild pain or headache (or Fever >/= 101).   apixaban 5 MG Tabs tablet Commonly known as:  ELIQUIS Take 2 tablets (10 mg total) by mouth 2 (two) times daily for 7 days.   apixaban 5 MG Tabs tablet Commonly known as:  ELIQUIS Take 1 tablet (5 mg total) by mouth 2 (two) times daily. Start taking on:  January 08, 2019   ascorbic acid 1000 MG tablet Commonly known as:  VITAMIN C Take 1,000 mg by mouth 2 (two) times daily.   calcium citrate-vitamin D 315-200 MG-UNIT tablet Commonly known as:  CITRACAL+D Take 1 tablet by mouth 2 (two) times daily.   eplerenone 50 MG tablet Commonly known as:  INSPRA Take 0.5 tablets (25 mg total) by mouth every morning. Start taking on:  January 04, 2019 What changed:    how much to take  These instructions start on January 04, 2019. If you are unsure what to do until then, ask your doctor or other care provider.   ferrous sulfate 325 (65 FE) MG tablet Take 325 mg by mouth 2 (two) times daily.   HYDROcodone-homatropine 5-1.5 MG/5ML syrup Commonly known as:  HYCODAN Take 5 mLs by mouth every 4 (four) hours as needed (intractable coughing spells.).   HYDROmorphone 2 MG tablet Commonly known as:  DILAUDID Take 2 mg by mouth every 4 (four) hours.    isosorbide mononitrate 10 MG tablet Commonly known as:  ISMO,MONOKET Take 1 tablet (10 mg total) by mouth 2 (two) times daily. Start taking on:  January 03, 2019 What changed:    how much to take  These instructions start on January 03, 2019. If you are unsure what to do until then, ask your doctor or other care provider.   loperamide 2 MG tablet Commonly known as:  IMODIUM A-D Take 2 mg by mouth 4 (four) times daily as needed for diarrhea or loose stools.   MAGONATE 54 (Mag Equiv) MG/5ML Liqd Generic drug:  Magnesium Carbonate Take 5 mLs by mouth 4 (four) times daily.   metFORMIN 1000 MG tablet Commonly known as:  GLUCOPHAGE Take 1,000 mg by mouth 2 (two) times daily with a meal.   metoprolol tartrate 25 MG tablet Commonly known as:  LOPRESSOR Take 25 mg by mouth daily.   multivitamin with minerals Tabs tablet Take 1 tablet by mouth daily.   NOVOLOG FLEXPEN 100 UNIT/ML FlexPen Generic drug:  insulin aspart Inject 12 Units into the skin 3 (three) times daily with meals. Add 2 units for every level of 50 over as directed by physician per sliding scale   nystatin powder Commonly known as:  MYCOSTATIN/NYSTOP Apply  topically 2 (two) times daily as needed (for redness/irritation).   omeprazole 20 MG capsule Commonly known as:  PRILOSEC Take 20 mg by mouth every morning.   OVER THE COUNTER MEDICATION Take 1,350 mg by mouth 2 (two) times daily. [place]ley [place]   oxybutynin 5 MG tablet Commonly known as:  DITROPAN Take 5 mg by mouth 3 (three) times daily as needed for bladder spasms.   potassium chloride SA 20 MEQ tablet Commonly known as:  K-DUR,KLOR-CON Take 1 tablet (20 mEq total) by mouth daily.   sulfamethoxazole-trimethoprim 800-160 MG tablet Commonly known as:  BACTRIM DS,SEPTRA DS Take 1 tablet by mouth every 12 (twelve) hours for 6 days.   TRESIBA FLEXTOUCH 200 UNIT/ML Sopn Generic drug:  Insulin Degludec Inject 128 Units into the skin every morning.    TRULICITY 0.75 MG/0.5ML Sopn Generic drug:  Dulaglutide Inject 0.75 mg into the skin every Sunday.   Vitamin D (Ergocalciferol) 1.25 MG (50000 UT) Caps capsule Commonly known as:  DRISDOL Take 50,000 Units by mouth 2 (two) times a week. Mondays and Thursdays   zinc gluconate 50 MG tablet Take 50 mg by mouth 2 (two) times daily.      Allergies  Allergen Reactions  . Ace Inhibitors Swelling  . Amlodipine Swelling  . Ceftriaxone Hives and Other (See Comments)    Reaction is unknown to patient. Possible reaction resulting in hives   . Nsaids Other (See Comments)    Stomach pain  . Phenergan [Promethazine Hcl] Other (See Comments)    hallucinations  . Versed [Midazolam] Nausea And Vomiting  . Adhesive [Tape] Rash    Skin irritation  . Contrast Media [Iodinated Diagnostic Agents] Rash  . Latex Rash    Skin irritation-severe    The results of significant diagnostics from this hospitalization (including imaging, microbiology, ancillary and laboratory) are listed below for reference.    Significant Diagnostic Studies: Dg Chest 2 View  Result Date: 12/28/2018 CLINICAL DATA:  Cough and fever. EXAM: CHEST - 2 VIEW COMPARISON:  None. FINDINGS: The heart, hila, and mediastinum are normal. There is opacity in the right base. No other acute abnormalities. IMPRESSION: Right basilar opacity concerning for pneumonia given history. Recommend short-term follow-up chest x-ray to ensure resolution of the pulmonary opacity. No other acute abnormalities identified. Electronically Signed   By: Gerome Samavid  Williams III M.D   On: 12/28/2018 18:02   Ct Angio Chest Pe W And/or Wo Contrast  Result Date: 12/28/2018 CLINICAL DATA:  Surgery on November 14th to correct fistula. Suprapubic catheter. Colostomy bag. Right-sided flank pain with productive cough, hemoptysis. Ex-smoker. EXAM: CT ANGIOGRAPHY CHEST CT ABDOMEN AND PELVIS WITH CONTRAST TECHNIQUE: Multidetector CT imaging of the chest was performed using the  standard protocol during bolus administration of intravenous contrast. Multiplanar CT image reconstructions and MIPs were obtained to evaluate the vascular anatomy. Multidetector CT imaging of the abdomen and pelvis was performed using the standard protocol during bolus administration of intravenous contrast. CONTRAST:  100mL ISOVUE-300 IOPAMIDOL (ISOVUE-300) INJECTION 61% COMPARISON:  Chest radiograph of earlier today FINDINGS: CTA CHEST FINDINGS Cardiovascular: The quality of this exam for evaluation of pulmonary embolism is sufficient. Large volume right lower lobe pulmonary emboli, including within the lobar and majority of segmental branches, including on image 47/5. Suspect mild right heart strain, as evidenced by an RA to RV ratio of 41/42 mm. Aortic and branch vessel atherosclerosis. Normal heart size, without pericardial effusion. Multivessel coronary artery atherosclerosis. Mediastinum/Nodes: A node within the azygoesophageal recess is mildly enlarged at  11 mm on image 44/5. Right hilar node of 12 mm on image 38/5. Lungs/Pleura: Trace right pleural fluid or thickening. Mild degradation secondary to right arm position, not raised above the head. Patient body habitus degradation as well. Mild left base subsegmental atelectasis. Patchy right lower lobe airspace disease. Musculoskeletal: Bilateral moderate gynecomastia. No acute osseous abnormality. Review of the MIP images confirms the above findings. CT ABDOMEN and PELVIS FINDINGS Hepatobiliary: Mild cirrhosis, as evidenced by an irregular hepatic capsule and relative enlargement of the caudate lobe. No focal liver lesion. Normal gallbladder, without biliary ductal dilatation. Pancreas: Normal, without mass or ductal dilatation. Spleen: Splenomegaly at 16.1 cm craniocaudal. Adrenals/Urinary Tract: Mild left adrenal thickening. Normal right adrenal gland. Normal kidneys, without hydronephrosis. Suprapubic catheter decompresses the urinary bladder. There is  also Foley catheter which is incompletely imaged. Stomach/Bowel: Proximal gastric underdistention. Right-sided probable loop type colostomy. Normal small bowel caliber. Normal appendix. Normal small bowel caliber. Vascular/Lymphatic: Aortic and branch vessel atherosclerosis. Patent portal and splenic veins. No specific evidence of portal venous hypertension. No abdominopelvic adenopathy. Reproductive: Prostatectomy. Other: No significant free fluid. Small fat containing left inguinal hernia. Ventral abdominal wall laxity and a small fat containing right paramidline hernia. Musculoskeletal: No acute osseous abnormality. Review of the MIP images confirms the above findings. IMPRESSION: 1. Moderate to large volume right-sided pulmonary embolism. Positive for acute PE with CT evidence of right heart strain (RV/LV Ratio = 0.97) consistent with at least submassive (intermediate risk) PE. The presence of right heart strain has been associated with an increased risk of morbidity and mortality. Please activate Code PE by paging [phone]. Left base atelectasis. Right base airspace disease could represent atelectasis and/or developing infarct. 2. No acute process in the abdomen or pelvis. 3. Cirrhosis and splenomegaly. 4. Coronary artery atherosclerosis. Aortic Atherosclerosis (ICD10-I70.0). 5. Bilateral gynecomastia. 6. Mild thoracic adenopathy, most likely reactive. These results will be called to the ordering clinician or representative by the Radiologist Assistant, and communication documented in the PACS or zVision Dashboard. Electronically Signed   By: Jeronimo Greaves M.D.   On: 12/28/2018 23:36   Ct Abdomen Pelvis W Contrast  Result Date: 12/28/2018 CLINICAL DATA:  Surgery on November 14th to correct fistula. Suprapubic catheter. Colostomy bag. Right-sided flank pain with productive cough, hemoptysis. Ex-smoker. EXAM: CT ANGIOGRAPHY CHEST CT ABDOMEN AND PELVIS WITH CONTRAST TECHNIQUE: Multidetector CT imaging of  the chest was performed using the standard protocol during bolus administration of intravenous contrast. Multiplanar CT image reconstructions and MIPs were obtained to evaluate the vascular anatomy. Multidetector CT imaging of the abdomen and pelvis was performed using the standard protocol during bolus administration of intravenous contrast. CONTRAST:  ISOVUE-300 IOPAMIDOL (ISOVUE-300) INJECTION 61% COMPARISON:  Chest radiograph of earlier today FINDINGS: CTA CHEST FINDINGS Cardiovascular: The quality of this exam for evaluation of pulmonary embolism is sufficient. Large volume right lower lobe pulmonary emboli, including within the lobar and majority of segmental branches, including on image 47/5. Suspect mild right heart strain, as evidenced by an RA to RV ratio of 41/42 mm. Aortic and branch vessel atherosclerosis. Normal heart size, without pericardial effusion. Multivessel coronary artery atherosclerosis. Mediastinum/Nodes: A node within the azygoesophageal recess is mildly enlarged at 11 mm on image 44/5. Right hilar node of 12 mm on image 38/5. Lungs/Pleura: Trace right pleural fluid or thickening. Mild degradation secondary to right arm position, not raised above the head. Patient body habitus degradation as well. Mild left base subsegmental atelectasis. Patchy right lower lobe airspace disease. Musculoskeletal: Bilateral  moderate gynecomastia. No acute osseous abnormality. Review of the MIP images confirms the above findings. CT ABDOMEN and PELVIS FINDINGS Hepatobiliary: Mild cirrhosis, as evidenced by an irregular hepatic capsule and relative enlargement of the caudate lobe. No focal liver lesion. Normal gallbladder, without biliary ductal dilatation. Pancreas: Normal, without mass or ductal dilatation. Spleen: Splenomegaly at 16.1 cm craniocaudal. Adrenals/Urinary Tract: Mild left adrenal thickening. Normal right adrenal gland. Normal kidneys, without hydronephrosis. Suprapubic catheter  decompresses the urinary bladder. There is also Foley catheter which is incompletely imaged. Stomach/Bowel: Proximal gastric underdistention. Right-sided probable loop type colostomy. Normal small bowel caliber. Normal appendix. Normal small bowel caliber. Vascular/Lymphatic: Aortic and branch vessel atherosclerosis. Patent portal and splenic veins. No specific evidence of portal venous hypertension. No abdominopelvic adenopathy. Reproductive: Prostatectomy. Other: No significant free fluid. Small fat containing left inguinal hernia. Ventral abdominal wall laxity and a small fat containing right paramidline hernia. Musculoskeletal: No acute osseous abnormality. Review of the MIP images confirms the above findings. IMPRESSION: 1. Moderate to large volume right-sided pulmonary embolism. Positive for acute PE with CT evidence of right heart strain (RV/LV Ratio = 0.97) consistent with at least submassive (intermediate risk) PE. The presence of right heart strain has been associated with an increased risk of morbidity and mortality. Please activate Code PE by paging [phone]. Left base atelectasis. Right base airspace disease could represent atelectasis and/or developing infarct. 2. No acute process in the abdomen or pelvis. 3. Cirrhosis and splenomegaly. 4. Coronary artery atherosclerosis. Aortic Atherosclerosis (ICD10-I70.0). 5. Bilateral gynecomastia. 6. Mild thoracic adenopathy, most likely reactive. These results will be called to the ordering clinician or representative by the Radiologist Assistant, and communication documented in the PACS or zVision Dashboard. Electronically Signed   By: Jeronimo Greaves M.D.   On: 12/28/2018 23:36   Dg Chest Port 1 View  Result Date: 12/31/2018 CLINICAL DATA:  Hemoptysis EXAM: PORTABLE CHEST 1 VIEW COMPARISON:  None. FINDINGS: The heart size and mediastinal contours are within normal limits. Both lungs are clear. The visualized skeletal structures are unremarkable.  IMPRESSION: No active disease. Electronically Signed   By: Jasmine Pang M.D.   On: 12/31/2018 19:49    Microbiology: Recent Results (from the past 240 hour(s))  Blood Culture (routine x 2)     Status: None (Preliminary result)   Collection Time: 12/28/18  5:36 PM  Result Value Ref Range Status   Specimen Description BLOOD LEFT HAND  Final   Special Requests   Final    BOTTLES DRAWN AEROBIC AND ANAEROBIC Blood Culture adequate volume   Culture   Final    NO GROWTH 4 DAYS Performed at [facility], [address], [place], [place] [address]    Report Status PENDING  Incomplete  Urine culture     Status: Abnormal   Collection Time: 12/28/18  5:50 PM  Result Value Ref Range Status   Specimen Description   Final    URINE, SUPRAPUBIC Performed at [facility], [address], [place], [place] [address]    Special Requests   Final    NONE Performed at [facility], [address] [place], [place] [address]    Culture >=100,000 COLONIES/mL ESCHERICHIA COLI (A)  Final   Report Status 12/31/2018 FINAL  Final   Organism ID, Bacteria ESCHERICHIA COLI (A)  Final      Susceptibility   Escherichia coli - MIC*    AMPICILLIN 16 INTERMEDIATE Intermediate     CEFAZOLIN <=4 SENSITIVE Sensitive     CEFTRIAXONE <=1 SENSITIVE  Sensitive     CIPROFLOXACIN >=4 RESISTANT Resistant     GENTAMICIN <=1 SENSITIVE Sensitive     IMIPENEM <=0.25 SENSITIVE Sensitive     NITROFURANTOIN <=16 SENSITIVE Sensitive     TRIMETH/SULFA <=20 SENSITIVE Sensitive     AMPICILLIN/SULBACTAM 4 SENSITIVE Sensitive     PIP/TAZO <=4 SENSITIVE Sensitive     Extended ESBL NEGATIVE Sensitive     * >=100,000 COLONIES/mL ESCHERICHIA COLI  Blood Culture (routine x 2)     Status: None (Preliminary result)   Collection Time: 12/28/18  6:47 PM  Result Value Ref Range Status   Specimen Description LEFT ANTECUBITAL  Final   Special Requests   Final    BOTTLES DRAWN AEROBIC AND ANAEROBIC Blood Culture adequate volume   Culture    Final    NO GROWTH 4 DAYS Performed at [facility], [address], [place], [place] [address]    Report Status PENDING  Incomplete  MRSA PCR Screening     Status: None   Collection Time: 12/30/18  1:14 PM  Result Value Ref Range Status   MRSA by PCR NEGATIVE NEGATIVE Final    Comment:        The GeneXpert MRSA Assay (FDA approved for NASAL specimens only), is one component of a comprehensive MRSA colonization surveillance program. It is not intended to diagnose MRSA infection nor to guide or monitor treatment for MRSA infections. Performed at [facility], [address] [place], [place] [address]      Labs: Basic Metabolic Panel: Recent Labs  Lab 12/28/18 1723 12/29/18 0712 12/30/18 0746 12/31/18 0742 01/01/19 0449  NA 128* 132* 134* 137 138  K 4.2 4.7 3.5 3.4* 3.1*  CL 93* 98 101 105 109  CO2 22 24 23  21* 23  GLUCOSE 285* 322* 92 89 81  BUN 20 24* 31* 23 16  CREATININE 1.04 1.02 0.93 0.92 0.80  CALCIUM 9.5 8.9 8.8* 8.6* 8.2*  MG  --   --  1.6*  --  1.4*   Liver Function Tests: Recent Labs  Lab 12/28/18 1723 12/29/18 0712  AST 26 19  ALT 22 20  ALKPHOS 60 54  BILITOT 1.3* 1.0  PROT 7.1 6.7  ALBUMIN 3.0* 2.7*   CBC: Recent Labs  Lab 12/28/18 1723 12/29/18 0712 12/30/18 0746 12/31/18 0742 01/01/19 0449  WBC 15.2* 9.2 10.5 6.5 5.6  NEUTROABS 12.8*  --   --   --   --   HGB 12.2* 11.6* 10.6* 11.3* 10.5*  HCT 37.3* 35.9* 33.2* 34.1* 33.2*  MCV 89.2 91.8 90.0 90.9 92.0  PLT 131* 102* 136* 152 125*   Cardiac Enzymes: Recent Labs  Lab 12/28/18 1723 12/29/18 0027 12/29/18 0712 12/29/18 1409  TROPONINI <0.03 <0.03 <0.03 <0.03   CBG: Recent Labs  Lab 12/29/18 2148 12/30/18 0731 12/31/18 0734 01/01/19 0832 01/01/19 1121  GLUCAP 199* 93 88 118* 335*    Signed:  Vassie Loll MD.  [facility] 01/01/2019, 1:42 PM

## 2019-01-01 NOTE — Progress Notes (Signed)
ANTICOAGULATION CONSULT NOTE   Pharmacy Consult for heparin Indication: pulmonary embolus  Allergies  Allergen Reactions  . Ace Inhibitors Swelling  . Amlodipine Swelling  . Ceftriaxone Hives and Other (See Comments)    Reaction is unknown to patient. Possible reaction resulting in hives   . Nsaids Other (See Comments)    Stomach pain  . Phenergan [Promethazine Hcl] Other (See Comments)    hallucinations  . Versed [Midazolam] Nausea And Vomiting  . Adhesive [Tape] Rash    Skin irritation  . Contrast Media [Iodinated Diagnostic Agents] Rash  . Latex Rash    Skin irritation-severe    Patient Measurements: Height: 5\' 10"  (177.8 cm) Weight: 207 lb 7.3 oz (94.1 kg) IBW/kg (Calculated) : 73 Heparin Dosing Weight: 92 kg  Vital Signs: Temp: 98.2 F (36.8 C) (01/21 2216) Temp Source: Oral (01/21 2216) BP: 127/86 (01/21 2216) Pulse Rate: 63 (01/21 2216)  Labs: Recent Labs    12/29/18 0712 12/29/18 1409 12/30/18 0746  12/31/18 0742 12/31/18 1640 01/01/19 0052  HGB 11.6*  --  10.6*  --  11.3*  --   --   HCT 35.9*  --  33.2*  --  34.1*  --   --   PLT 102*  --  136*  --  152  --   --   HEPARINUNFRC 0.36  --   --    < > 0.24* 0.24* 0.87*  CREATININE 1.02  --  0.93  --  0.92  --   --   TROPONINI <0.03 <0.03  --   --   --   --   --    < > = values in this interval not displayed.    Estimated Creatinine Clearance: 92.2 mL/min (by C-G formula based on SCr of 0.92 mg/dL).  Assessment: 68 yo male presented with worsening cough and fever.  Chest x-ray showed possible RLL pneumonia.  CTA showed moderate large volume right sided pulmonary embolism.    Heparin level now up to supratherapeutic (0.87) on gtt at 1800 units/hr. No bleeding noted.  Goal of Therapy:  Heparin level 0.3-0.7 units/ml Monitor platelets by anticoagulation protocol: Yes   Plan:  Decrease heparin infusion to 1650 units/hr Check anti-Xa level in 6 hours and daily while on heparin Continue to monitor H&H  and platelets  Christoper Fabian, PharmD, BCPS Clinical pharmacist 01/01/2019 2:50 AM

## 2019-01-01 NOTE — Progress Notes (Signed)
ANTICOAGULATION CONSULT NOTE   Pharmacy Consult for heparin--> apixaban Indication: pulmonary embolus  Allergies  Allergen Reactions  . Ace Inhibitors Swelling  . Amlodipine Swelling  . Ceftriaxone Hives and Other (See Comments)    Reaction is unknown to patient. Possible reaction resulting in hives   . Nsaids Other (See Comments)    Stomach pain  . Phenergan [Promethazine Hcl] Other (See Comments)    hallucinations  . Versed [Midazolam] Nausea And Vomiting  . Adhesive [Tape] Rash    Skin irritation  . Contrast Media [Iodinated Diagnostic Agents] Rash  . Latex Rash    Skin irritation-severe    Patient Measurements: Height: 5\' 10"  (177.8 cm) Weight: 207 lb 7.3 oz (94.1 kg) IBW/kg (Calculated) : 73 Heparin Dosing Weight: 92 kg  Vital Signs: Temp: 97.9 F (36.6 C) (01/22 0904) Temp Source: Oral (01/22 0904) BP: 121/82 (01/22 0904) Pulse Rate: 75 (01/22 0904)  Labs: Recent Labs    12/29/18 1409  12/30/18 0746  12/31/18 0742 12/31/18 1640 01/01/19 0052 01/01/19 0449 01/01/19 0946  HGB  --    < > 10.6*  --  11.3*  --   --  10.5*  --   HCT  --   --  33.2*  --  34.1*  --   --  33.2*  --   PLT  --   --  136*  --  152  --   --  125*  --   HEPARINUNFRC  --   --   --    < > 0.24* 0.24* 0.87*  --  0.73*  CREATININE  --   --  0.93  --  0.92  --   --  0.80  --   TROPONINI <0.03  --   --   --   --   --   --   --   --    < > = values in this interval not displayed.    Estimated Creatinine Clearance: 106 mL/min (by C-G formula based on SCr of 0.8 mg/dL).  Assessment: 68 yo male presented with worsening cough and fever.  Chest x-ray showed possible RLL pneumonia.  CTA showed moderate large volume right sided pulmonary embolism.  Plan now is to transition to po eliquis for PE  Goal of Therapy:  Monitor platelets by anticoagulation protocol: Yes   Plan:  Discontinue heparin, start eliquis in 1 hour to 10mg  po bid for 7 days, then 5mg  po BID. Educate on eliquis Continue to  monitor H&H and platelets  Elder Cyphers, BS Loura Back, [place] Clinical Pharmacist Pager [phone] 01/01/2019 10:31 AM

## 2019-01-01 NOTE — Progress Notes (Signed)
Pt discharged to home in stable condition.  All discharge instructions reviewed with and given to pt and wife.  Pt transported via wheelchair with transporters x 1 at chairside.  [id number]

## 2019-01-01 NOTE — Care Management Important Message (Signed)
Important Message  Patient Details  Name: Michael Salinas MRN: [id number] Date of Birth: 31-Jan-1951   Medicare Important Message Given:  Yes    Renie Ora 01/01/2019, 11:12 AM

## 2019-01-01 NOTE — Progress Notes (Signed)
Hypoglycemic Event  CBG: 48  Treatment: pt given 4oz juice and per pt request an orange sherbet.  Symptoms: slightly diaphoretic    Follow-up CBG: Time:0800 CBG Result:118  Possible Reasons for Event: Pt gets 128 units of Lantus Q10 am and did not eat dinner last or snacks over night.   Comments/MD notified: [place] via [employer]

## 2019-01-02 LAB — CULTURE, BLOOD (ROUTINE X 2)
Culture: NO GROWTH
Culture: NO GROWTH
SPECIAL REQUESTS: ADEQUATE
SPECIAL REQUESTS: ADEQUATE

## 2019-01-14 LAB — GLUCOSE, CAPILLARY
GLUCOSE-CAPILLARY: 197 mg/dL — AB (ref 70–99)
Glucose-Capillary: 236 mg/dL — ABNORMAL HIGH (ref 70–99)
Glucose-Capillary: 284 mg/dL — ABNORMAL HIGH (ref 70–99)
Glucose-Capillary: 314 mg/dL — ABNORMAL HIGH (ref 70–99)
Glucose-Capillary: 206 mg/dL — ABNORMAL HIGH (ref 70–99)
Glucose-Capillary: 176 mg/dL — ABNORMAL HIGH (ref 70–99)
Glucose-Capillary: 48 mg/dL — ABNORMAL LOW (ref 70–99)

## 2020-06-24 IMAGING — CT CT ABD-PELV W/ CM
3 of 10 series · 10 of 46 positions shown, 16 images · IV contrast (Isovue)
Comparison: Chest radiograph of earlier today

CLINICAL DATA: Surgery on [REDACTED] to correct fistula.
Suprapubic catheter. Colostomy bag. Right-sided flank pain with
productive cough, hemoptysis. Ex-smoker.

EXAM:
CT ANGIOGRAPHY CHEST
CT ABDOMEN AND PELVIS WITH CONTRAST
TECHNIQUE: Multidetector CT imaging of the chest was performed using the
standard protocol during bolus administration of intravenous
contrast. Multiplanar CT image reconstructions and MIPs were
obtained to evaluate the vascular anatomy. Multidetector CT imaging
of the abdomen and pelvis was performed using the standard protocol
during bolus administration of intravenous contrast.
CONTRAST:  100mL N9NYYE-WRR IOPAMIDOL (N9NYYE-WRR) INJECTION 61%

[Series 6: thins · axial · 0.75mm/px · z∈[-66,+73]mm · 5 of 296 slices shown]
[im 35/296  soft-tissue]
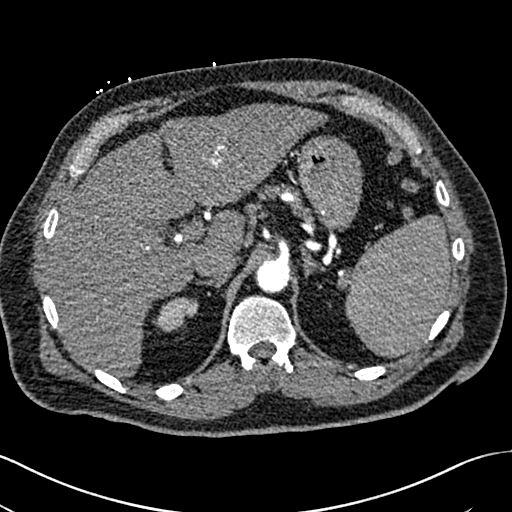
[im 70/296  soft-tissue]
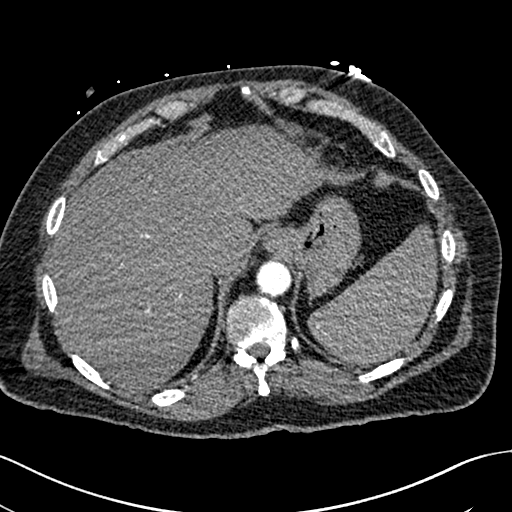
[im 105/296  soft-tissue]
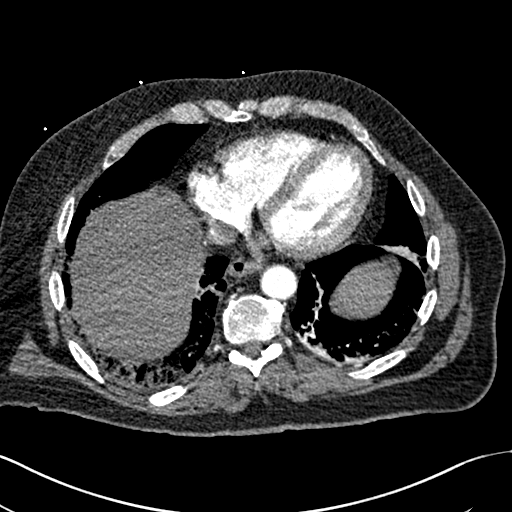
[im 139/296  soft-tissue]
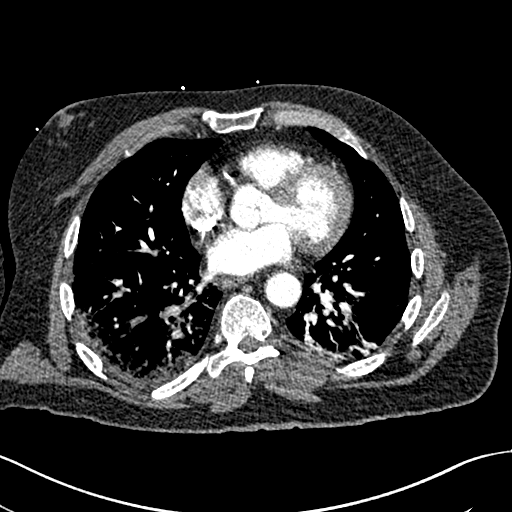
[im 174/296  soft-tissue]
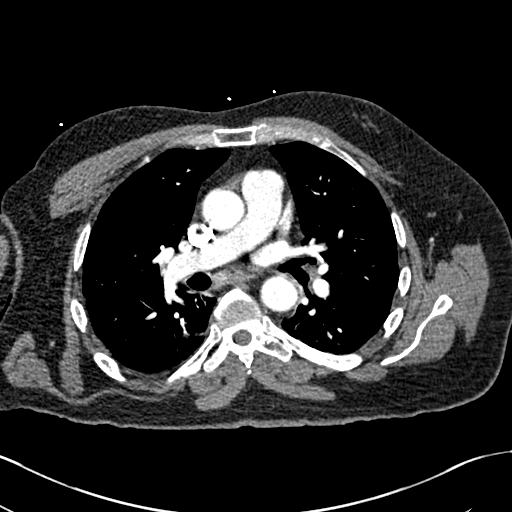

[Series 8: coronal mpr · coronal · 0.58mm/px · 1 of 147 slices shown, 2 images]
[im 74/147  soft-tissue]
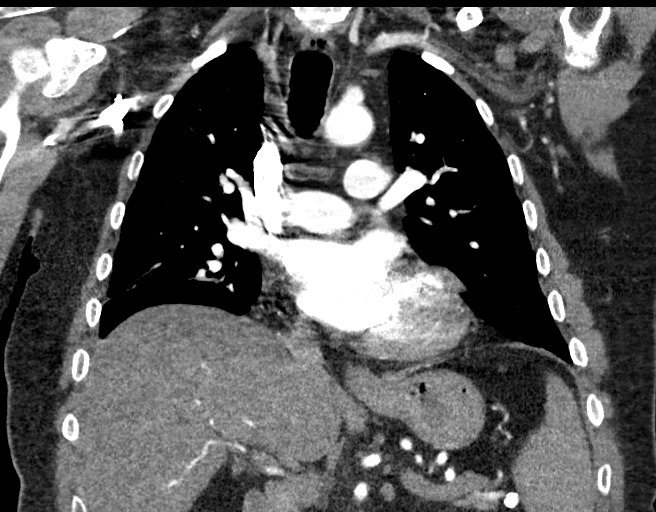
[im 74/147  bone]
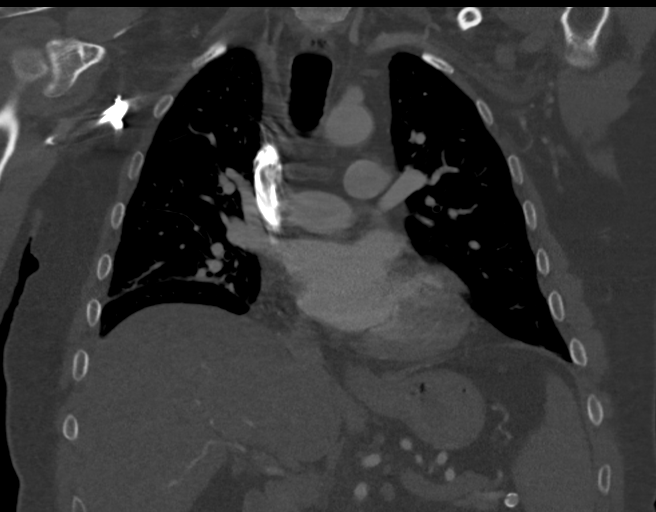

[Series 13: axial st · axial · 0.87mm/px · z∈[-359,-64]mm · 4 of 99 slices shown, 9 images]
[im 20/99  soft-tissue]
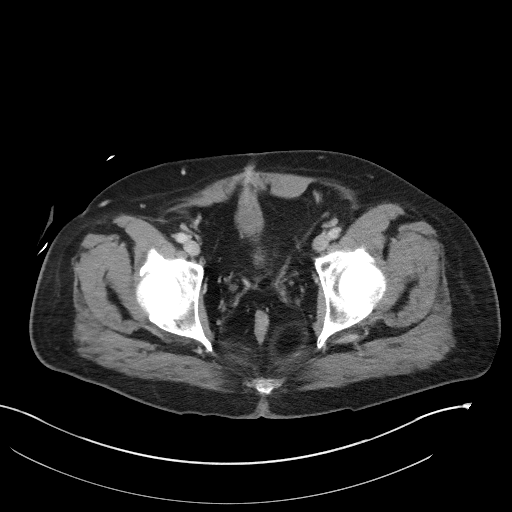
[im 20/99  lung]
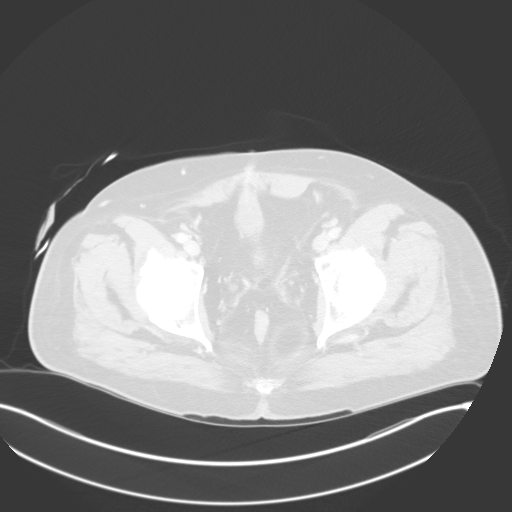
[im 20/99  bone]
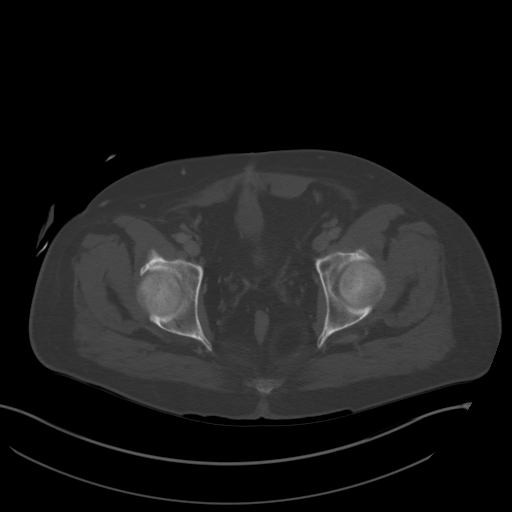
[im 40/99  soft-tissue]
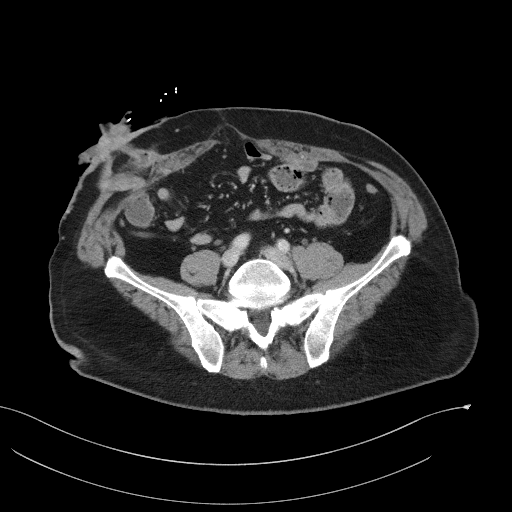
[im 40/99  lung]
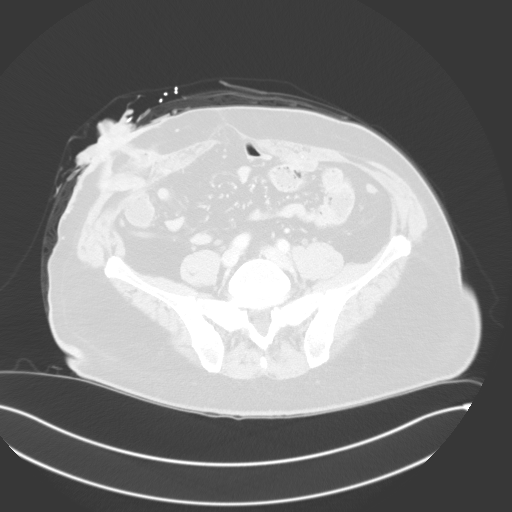
[im 59/99  soft-tissue]
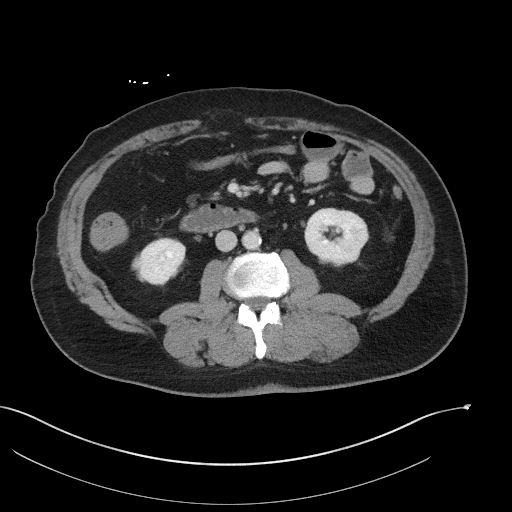
[im 59/99  lung]
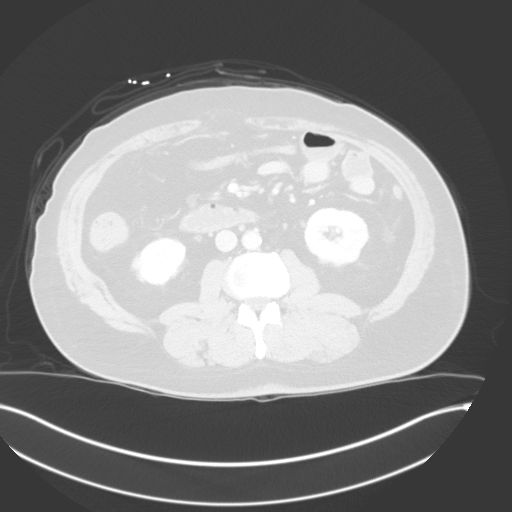
[im 79/99  soft-tissue]
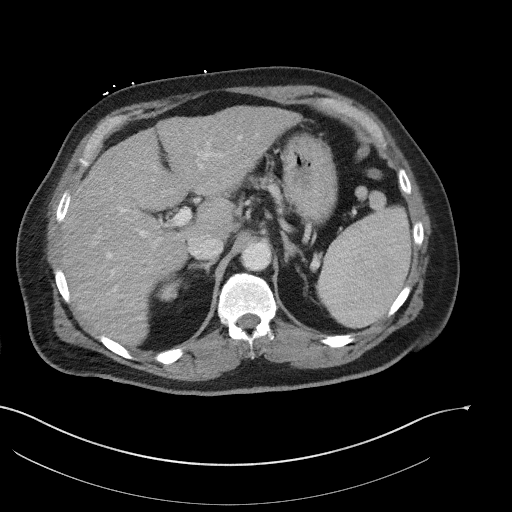
[im 79/99  lung]
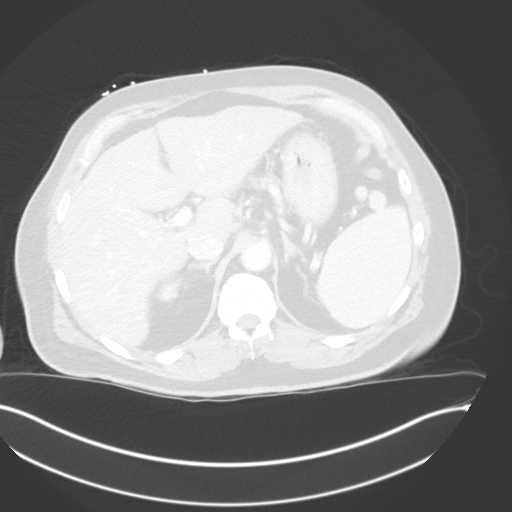

[10 of 46 positions shown; findings below may reference images not displayed]

FINDINGS: CTA CHEST FINDINGS

Cardiovascular: The quality of this exam for evaluation of pulmonary
embolism is sufficient. Large volume right lower lobe pulmonary
emboli, including within the lobar and majority of segmental
branches, including on image 47/5.

Suspect mild right heart strain, as evidenced by an RA to RV ratio
of 41/42 mm.

Aortic and branch vessel atherosclerosis. Normal heart size, without
pericardial effusion. Multivessel coronary artery atherosclerosis.

Mediastinum/Nodes: A node within the azygoesophageal recess is
mildly enlarged at 11 mm on image 44/5. Right hilar node of 12 mm on
image 38/5.

Lungs/Pleura: Trace right pleural fluid or thickening.

Mild degradation secondary to right arm position, not raised above
the head. Patient body habitus degradation as well.

Mild left base subsegmental atelectasis. Patchy right lower lobe
airspace disease.

Musculoskeletal: Bilateral moderate gynecomastia. No acute osseous
abnormality.

Review of the MIP images confirms the above findings.

CT ABDOMEN and PELVIS FINDINGS

Hepatobiliary: Mild cirrhosis, as evidenced by an irregular hepatic
capsule and relative enlargement of the caudate lobe. No focal liver
lesion. Normal gallbladder, without biliary ductal dilatation.

Pancreas: Normal, without mass or ductal dilatation.

Spleen: Splenomegaly at 16.1 cm craniocaudal.

Adrenals/Urinary Tract: Mild left adrenal thickening. Normal right
adrenal gland. Normal kidneys, without hydronephrosis. Suprapubic
catheter decompresses the urinary bladder. There is also Foley
catheter which is incompletely imaged.

Stomach/Bowel: Proximal gastric underdistention. Right-sided
probable loop type colostomy. Normal small bowel caliber. Normal
appendix. Normal small bowel caliber.

Vascular/Lymphatic: Aortic and branch vessel atherosclerosis. Patent
portal and splenic veins. No specific evidence of portal venous
hypertension. No abdominopelvic adenopathy.

Reproductive: Prostatectomy.

Other: No significant free fluid. Small fat containing left inguinal
hernia. Ventral abdominal wall laxity and a small fat containing
right paramidline hernia.

Musculoskeletal: No acute osseous abnormality.

Review of the MIP images confirms the above findings.
IMPRESSION: 1. Moderate to large volume right-sided pulmonary embolism. Positive
for acute PE with CT evidence of right heart strain (RV/LV Ratio =
0.97) consistent with at least submassive (intermediate risk) PE.
The presence of right heart strain has been associated with an
increased risk of morbidity and mortality. Please activate Code PE
by paging [phone]. Left base atelectasis. Right base airspace
disease could represent atelectasis and/or developing infarct.
2. No acute process in the abdomen or pelvis.
3. Cirrhosis and splenomegaly.
4. Coronary artery atherosclerosis. Aortic Atherosclerosis
([id number]).
5. Bilateral gynecomastia.
6. Mild thoracic adenopathy, most likely reactive.

These results will be called to the ordering clinician or
representative by the Radiologist Assistant, and communication
documented in the PACS or zVision Dashboard.

## 2020-06-27 IMAGING — CR DG CHEST 1V PORT
1 series · 1 of 1 positions shown · non-contrast
Comparison: None.

CLINICAL DATA: Hemoptysis

EXAM:
PORTABLE CHEST 1 VIEW

[portable]
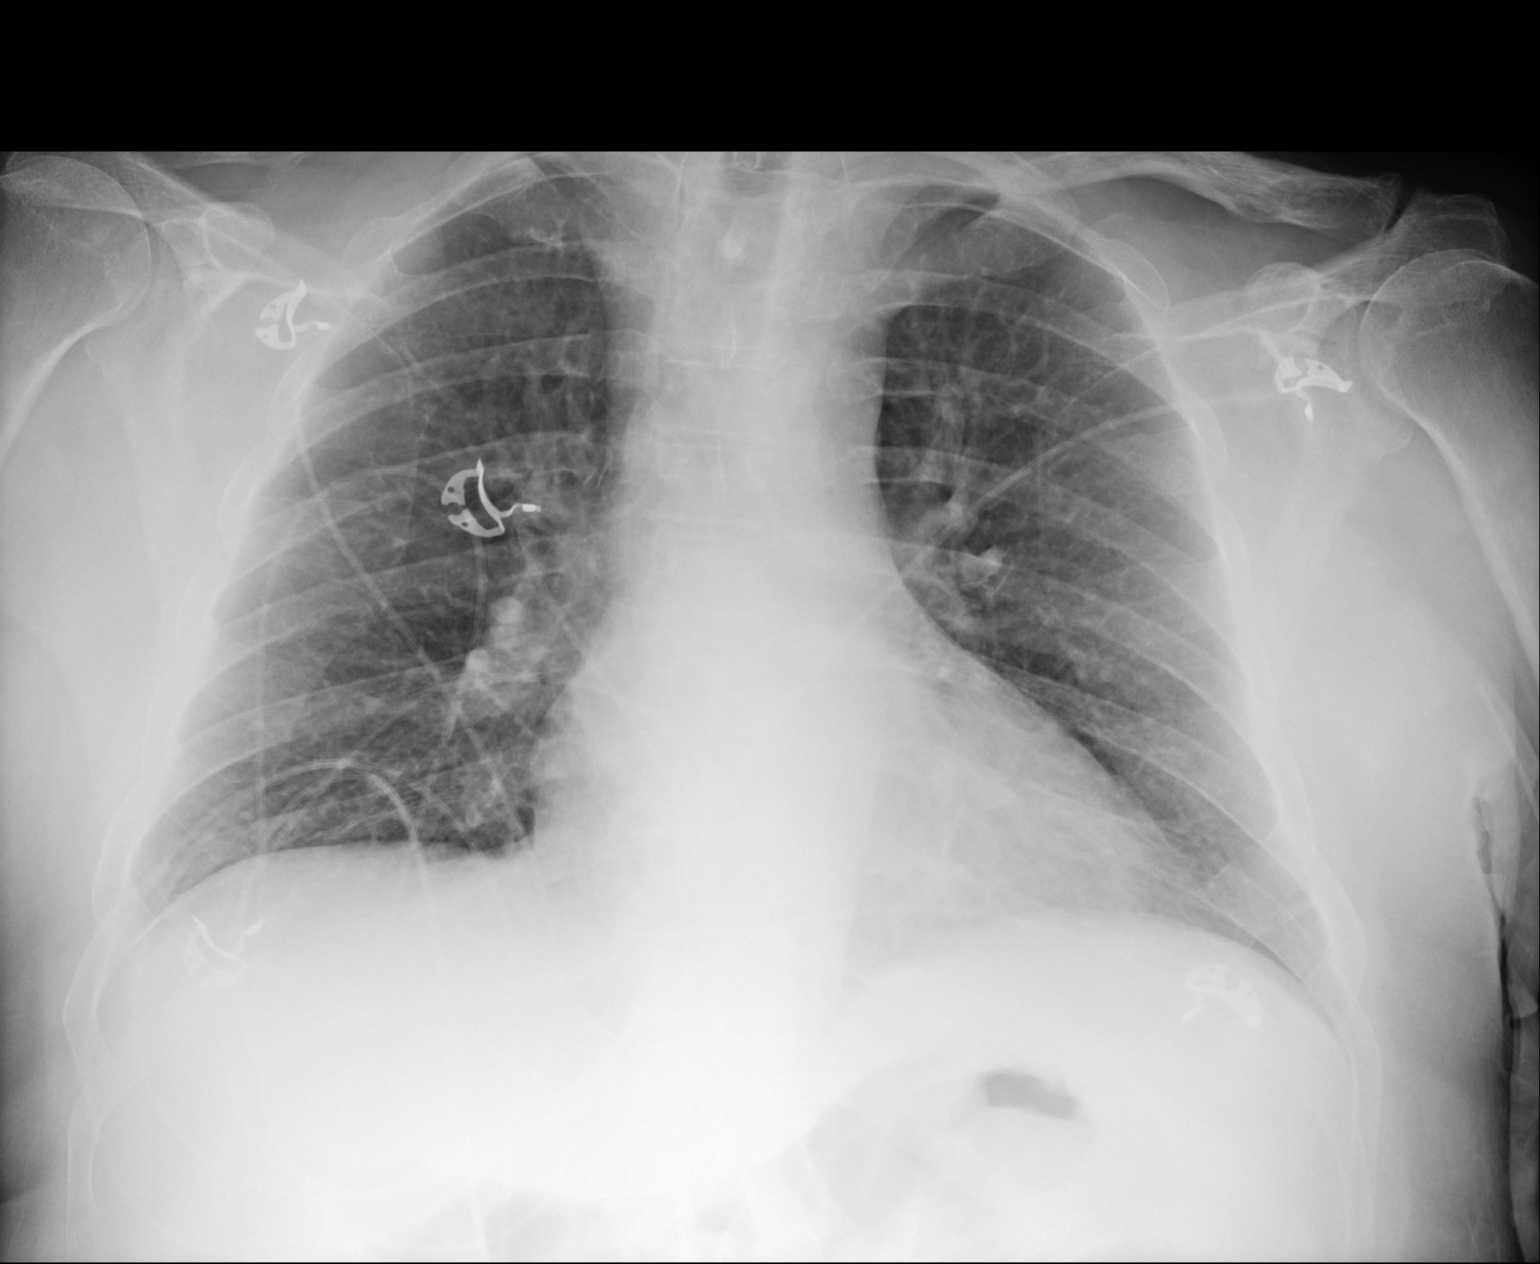

[1 of 1 positions shown; findings below may reference images not displayed]

FINDINGS: The heart size and mediastinal contours are within normal limits.
Both lungs are clear. The visualized skeletal structures are
unremarkable.
IMPRESSION: No active disease.
